# Patient Record
Sex: Female | Born: 1990 | Race: White | Hispanic: No | Marital: Married | Smoking: Never smoker
Health system: Southern US, Community
[De-identification: ages and names within clinical notes are randomized; demographics above are authoritative.]

## PROBLEM LIST (undated history)

## (undated) DIAGNOSIS — F419 Anxiety disorder, unspecified: Secondary | ICD-10-CM

## (undated) DIAGNOSIS — R87629 Unspecified abnormal cytological findings in specimens from vagina: Secondary | ICD-10-CM

## (undated) DIAGNOSIS — E039 Hypothyroidism, unspecified: Secondary | ICD-10-CM

## (undated) DIAGNOSIS — F988 Other specified behavioral and emotional disorders with onset usually occurring in childhood and adolescence: Secondary | ICD-10-CM

## (undated) HISTORY — DX: Other specified behavioral and emotional disorders with onset usually occurring in childhood and adolescence: F98.8

## (undated) HISTORY — PX: UPPER GI ENDOSCOPY: SHX6162

## (undated) HISTORY — PX: WISDOM TOOTH EXTRACTION: SHX21

## (undated) HISTORY — DX: Anxiety disorder, unspecified: F41.9

## (undated) HISTORY — DX: Unspecified abnormal cytological findings in specimens from vagina: R87.629

## (undated) HISTORY — DX: Hypothyroidism, unspecified: E03.9

---

## 2011-08-17 DIAGNOSIS — D696 Thrombocytopenia, unspecified: Secondary | ICD-10-CM | POA: Insufficient documentation

## 2012-01-04 DIAGNOSIS — F32A Depression, unspecified: Secondary | ICD-10-CM | POA: Insufficient documentation

## 2012-01-04 DIAGNOSIS — F329 Major depressive disorder, single episode, unspecified: Secondary | ICD-10-CM | POA: Insufficient documentation

## 2012-04-24 DIAGNOSIS — IMO0002 Reserved for concepts with insufficient information to code with codable children: Secondary | ICD-10-CM | POA: Insufficient documentation

## 2014-10-30 ENCOUNTER — Encounter: Payer: Self-pay | Admitting: Family Medicine

## 2014-10-30 ENCOUNTER — Ambulatory Visit (INDEPENDENT_AMBULATORY_CARE_PROVIDER_SITE_OTHER): Payer: BLUE CROSS/BLUE SHIELD | Admitting: Family Medicine

## 2014-10-30 VITALS — BP 94/57 | HR 76 | Ht 60.0 in | Wt 93.0 lb

## 2014-10-30 DIAGNOSIS — R51 Headache: Secondary | ICD-10-CM | POA: Diagnosis not present

## 2014-10-30 DIAGNOSIS — R519 Headache, unspecified: Secondary | ICD-10-CM | POA: Insufficient documentation

## 2014-10-30 DIAGNOSIS — R208 Other disturbances of skin sensation: Secondary | ICD-10-CM

## 2014-10-30 DIAGNOSIS — F411 Generalized anxiety disorder: Secondary | ICD-10-CM

## 2014-10-30 DIAGNOSIS — R200 Anesthesia of skin: Secondary | ICD-10-CM | POA: Insufficient documentation

## 2014-10-30 MED ORDER — CLONAZEPAM 0.5 MG PO TABS: 0.2500 mg | ORAL_TABLET | Freq: Every day | ORAL | Status: DC | PRN

## 2014-10-30 NOTE — Progress Notes (Signed)
CC: Danielle [name] is a 24 y.o. female is here for Establish Care and pressure in head   Subjective: HPI:  Very pleasant 24 year old here to establish care  Patient reports a history of anxiety that has been present for the majority of her adult life. She was once on citalopram however she felt that it made symptoms worse. For reasons unknown to her July has been very tough month with respect to anxiety. She tells me she had a panic attack on a daily basis ever since July 1. She's also been experiencing a pressure sensation in her head like her head is about to explode. Everyday she seems to have a different part of her body feel numb. On the 10th of this month her left side of her face was numb and she was seen at a local emergency room. Blood work was unremarkable. She denies any recent head injury. She reports poor short-term memory but this is been present her entire life. The more she thinks about her symptoms the worse they get.  She is anxious that this could be representing something seriously wrong with her brain. She denies any coordination difficulty, weakness, difficulty swallowing, difficulty talking or vision disturbance.  Review of Systems - General ROS: negative for - chills, fever, night sweats, weight gain or weight loss Ophthalmic ROS: negative for - decreased vision Psychological ROS: negative for - depression ENT ROS: negative for - hearing change, nasal congestion, tinnitus or allergies Hematological and Lymphatic ROS: negative for - bleeding problems, bruising or swollen lymph nodes Breast ROS: negative Respiratory ROS: no cough, shortness of breath, or wheezing Cardiovascular ROS: no chest pain or dyspnea on exertion Gastrointestinal ROS: no abdominal pain, change in bowel habits, or black or bloody stools Genito-Urinary ROS: negative for - genital discharge, genital ulcers, incontinence or abnormal bleeding from genitals Musculoskeletal ROS: negative for - joint pain or  muscle pain Neurological ROS: negative for - headaches or memory loss Dermatological ROS: negative for lumps, mole changes, rash and skin lesion changes  History reviewed. No pertinent past medical history.  History reviewed. No pertinent past surgical history. Family History  Problem Relation Age of Onset  . Breast cancer      grandmother   . Heart attack      grandfather  . Diabetes      grandfather   . Hypertension Mother     father     History   Social History  . Marital Status: Married    Spouse Name: N/A  . Number of Children: N/A  . Years of Education: N/A   Occupational History  . Not on file.   Social History Main Topics  . Smoking status: Never Smoker   . Smokeless tobacco: Not on file  . Alcohol Use: Not on file  . Drug Use: No  . Sexual Activity:    Partners: Male   Other Topics Concern  . Not on file   Social History Narrative  . No narrative on file     Objective: BP 94/57 mmHg  Pulse 76  Ht 5' (1.524 m)  Wt 93 lb (42.185 kg)  BMI 18.16 kg/m2  General: Alert and Oriented, No Acute Distress HEENT: Pupils equal, round, reactive to light. Conjunctivae clear. Moist mucous membranes Unremarkable Neuro: CN II-XII grossly intact, full strength/rom of all four extremities, C5/L4/S1 DTRs 2/4 bilaterally, gait normal, rapid alternating movements normal, heel-shin test normal, Rhomberg normal. Lungs: clear and comfortable work of breathing Cardiac: Regular rate and rhythm.  Extremities:  No peripheral edema.  Strong peripheral pulses.  Mental Status: mildly anxious. No depression nor agitation. Skin: Warm and dry.  Assessment & Plan: Danielle [name] was seen today for establish care and pressure in head.  Diagnoses and all orders for this visit:  Nonintractable headache, unspecified chronicity pattern, unspecified headache type Orders: -     MR Brain W Wo Contrast; Future  Facial numbness Orders: -     MR Brain W Wo Contrast; Future  Leg  numbness Orders: -     MR Brain W Wo Contrast; Future  Generalized anxiety disorder Orders: -     clonazePAM (KLONOPIN) 0.5 MG tablet; Take 0.5-1 tablets (0.25-0.5 mg total) by mouth daily as needed for anxiety.   Reassurance provided that her neurologic exam is overall reassuring. She tells me that the fear that something is wrong with her brain is overwhelming to the point where it is severely impacting the quality of her life. She would like to know if she can have an MRI of her brain. Although I would be very surprised if this were the case I discussed that some of her symptoms could represent multiple sclerosis and that an MRI would not be unreasonable to look for this. For now try to management attacks with clonazepam, she tells me she does not want to take something on a daily basis  Return if symptoms worsen or fail to improve, for Follow Up Based On MRI.

## 2014-11-02 ENCOUNTER — Ambulatory Visit (INDEPENDENT_AMBULATORY_CARE_PROVIDER_SITE_OTHER): Payer: BLUE CROSS/BLUE SHIELD

## 2014-11-02 ENCOUNTER — Other Ambulatory Visit: Payer: Self-pay | Admitting: Family Medicine

## 2014-11-02 DIAGNOSIS — R208 Other disturbances of skin sensation: Secondary | ICD-10-CM | POA: Diagnosis not present

## 2014-11-02 DIAGNOSIS — R519 Headache, unspecified: Secondary | ICD-10-CM

## 2014-11-02 DIAGNOSIS — R200 Anesthesia of skin: Secondary | ICD-10-CM

## 2014-11-02 DIAGNOSIS — R51 Headache: Principal | ICD-10-CM

## 2014-11-10 ENCOUNTER — Encounter: Payer: Self-pay | Admitting: Family Medicine

## 2014-11-10 ENCOUNTER — Ambulatory Visit (INDEPENDENT_AMBULATORY_CARE_PROVIDER_SITE_OTHER): Payer: BLUE CROSS/BLUE SHIELD | Admitting: Family Medicine

## 2014-11-10 VITALS — BP 96/65 | HR 73 | Ht 60.0 in | Wt 92.0 lb

## 2014-11-10 DIAGNOSIS — D649 Anemia, unspecified: Secondary | ICD-10-CM

## 2014-11-10 DIAGNOSIS — F411 Generalized anxiety disorder: Secondary | ICD-10-CM | POA: Diagnosis not present

## 2014-11-10 DIAGNOSIS — R002 Palpitations: Secondary | ICD-10-CM | POA: Diagnosis not present

## 2014-11-10 DIAGNOSIS — Z0000 Encounter for general adult medical examination without abnormal findings: Secondary | ICD-10-CM

## 2014-11-10 LAB — CBC
HEMATOCRIT: 37.7 % (ref 36.0–46.0)
HEMOGLOBIN: 12.4 g/dL (ref 12.0–15.0)
MCH: 28.9 pg (ref 26.0–34.0)
MCHC: 32.9 g/dL (ref 30.0–36.0)
MCV: 87.9 fL (ref 78.0–100.0)
MPV: 13.4 fL — AB (ref 8.6–12.4)
PLATELETS: 153 10*3/uL (ref 150–400)
RBC: 4.29 MIL/uL (ref 3.87–5.11)
RDW: 13.7 % (ref 11.5–15.5)
WBC: 4.1 10*3/uL (ref 4.0–10.5)

## 2014-11-10 MED ORDER — CLONAZEPAM 0.5 MG PO TABS: 0.2500 mg | ORAL_TABLET | Freq: Every day | ORAL | Status: DC | PRN

## 2014-11-10 NOTE — Patient Instructions (Signed)
Dr. Natilee Gauer's General Advice Following Your Complete Physical Exam  The Benefits of Regular Exercise: Unless you suffer from an uncontrolled cardiovascular condition, studies strongly suggest that regular exercise and physical activity will add to both the quality and length of your life.  The World Health Organization recommends 150 minutes of moderate intensity aerobic activity every week.  This is best split over 3-4 days a week, and can be as simple as a brisk walk for just over 35 minutes "most days of the week".  This type of exercise has been shown to lower LDL-Cholesterol, lower average blood sugars, lower blood pressure, lower cardiovascular disease risk, improve memory, and increase one's overall sense of wellbeing.  The addition of anaerobic (or "strength training") exercises offers additional benefits including but not limited to increased metabolism, prevention of osteoporosis, and improved overall cholesterol levels.  How Can I Strive For A Low-Fat Diet?: Current guidelines recommend that 25-35 percent of your daily energy (food) intake should come from fats.  One might ask how can this be achieved without having to dissect each meal on a daily basis?  Switch to skim or 1% milk instead of whole milk.  Focus on lean meats such as ground turkey, fresh fish, baked chicken, and lean cuts of beef as your source of dietary protein.  Limit saturated fat consumption to less than 10% of your daily caloric intake.  Limit trans fatty acid consumption primarily by limiting synthetic trans fats such as partially hydrogenated oils (Ex: fried fast foods).  Substitute olive or vegetable oil for solid fats where possible.  Moderation of Salt Intake: Provided you don't carry a diagnosis of congestive heart failure nor renal failure, I recommend a daily allowance of no more than 2300 mg of salt (sodium).  Keeping under this daily goal is associated with a decreased risk of cardiovascular events, creeping  above it can lead to elevated blood pressures and increases your risk of cardiovascular events.  Milligrams (mg) of salt is listed on all nutrition labels, and your daily intake can add up faster than you think.  Most canned and frozen dinners can pack in over half your daily salt allowance in one meal.    Lifestyle Health Risks: Certain lifestyle choices carry specific health risks.  As you may already know, tobacco use has been associated with increasing one's risk of cardiovascular disease, pulmonary disease, numerous cancers, among many other issues.  What you may not know is that there are medications and nicotine replacement strategies that can more than double your chances of successfully quitting.  I would be thrilled to help manage your quitting strategy if you currently use tobacco products.  When it comes to alcohol use, I've yet to find an "ideal" daily allowance.  Provided an individual does not have a medical condition that is exacerbated by alcohol consumption, general guidelines determine "safe drinking" as no more than two standard drinks for a man or no more than one standard drink for a female per day.  However, much debate still exists on whether any amount of alcohol consumption is technically "safe".  My general advice, keep alcohol consumption to a minimum for general health promotion.  If you or others believe that alcohol, tobacco, or recreational drug use is interfering with your life, I would be happy to provide confidential counseling regarding treatment options.  General "Over The Counter" Nutrition Advice: Postmenopausal women should aim for a daily calcium intake of 1200 mg, however a significant portion of this might already be   provided by diets including milk, yogurt, cheese, and other dairy products.  Vitamin D has been shown to help preserve bone density, prevent fatigue, and has even been shown to help reduce falls in the elderly.  Ensuring a daily intake of 800 Units of  Vitamin D is a good place to start to enjoy the above benefits, we can easily check your Vitamin D level to see if you'd potentially benefit from supplementation beyond 800 Units a day.  Folic Acid intake should be of particular concern to women of childbearing age.  Daily consumption of 400-800 mcg of Folic Acid is recommended to minimize the chance of spinal cord defects in a fetus should pregnancy occur.    For many adults, accidents still remain one of the most common culprits when it comes to cause of death.  Some of the simplest but most effective preventitive habits you can adopt include regular seatbelt use, proper helmet use, securing firearms, and regularly testing your smoke and carbon monoxide detectors.  Giah Fickett B. Herberth Deharo DO [facility] [address], [address] [place], [place] [address] Phone: [phone]  

## 2014-11-10 NOTE — Progress Notes (Signed)
CC: Danielle [name] is a 24 y.o. female is here for Annual Exam   Subjective: HPI:  Colonoscopy: no current indication Papsmear: she's not sure she's had one since she turned 21, [facility], defer to her OB/GYN Mammogram: no current indication   Influenza Vaccine: not indicated Pneumovax: no indication Td/Tdap: Tdap UTD until 2019 Zoster: (Start 24 yo)  Presents for complete physical exam requesting refill on clonazepam which is helping with anxiety. Her only complaint today is a flutter in her left chest that lasted 1 second when she was out shopping with her husband last week. She also has a history of anemia and since she is a vegan she would like to know if she is deficient in any vitamins.  Review of Systems - General ROS: negative for - chills, fever, night sweats, weight gain or weight loss Ophthalmic ROS: negative for - decreased vision Psychological ROS: negative for - anxiety or depression ENT ROS: negative for - hearing change, nasal congestion, tinnitus or allergies Hematological and Lymphatic ROS: negative for - bleeding problems, bruising or swollen lymph nodes Breast ROS: negative Respiratory ROS: no cough, shortness of breath, or wheezing Cardiovascular ROS: no chest pain or dyspnea on exertion Gastrointestinal ROS: no abdominal pain, change in bowel habits, or black or bloody stools Genito-Urinary ROS: negative for - genital discharge, genital ulcers, incontinence or abnormal bleeding from genitals Musculoskeletal ROS: negative for - joint pain or muscle pain Neurological ROS: negative for - headaches or memory loss Dermatological ROS: negative for lumps, mole changes, rash and skin lesion changes  No past medical history on file.  No past surgical history on file. Family History  Problem Relation Age of Onset  . Breast cancer      grandmother   . Heart attack      grandfather  . Diabetes      grandfather   . Hypertension  Mother     father     History   Social History  . Marital Status: Married    Spouse Name: N/A  . Number of Children: N/A  . Years of Education: N/A   Occupational History  . Not on file.   Social History Main Topics  . Smoking status: Never Smoker   . Smokeless tobacco: Not on file  . Alcohol Use: Not on file  . Drug Use: No  . Sexual Activity:    Partners: Male   Other Topics Concern  . Not on file   Social History Narrative  . No narrative on file     Objective: BP 96/65 mmHg  Pulse 73  Ht 5' (1.524 m)  Wt 92 lb (41.731 kg)  BMI 17.97 kg/m2  LMP 09/30/2014 (Approximate)  General: No Acute Distress HEENT: Atraumatic, normocephalic, conjunctivae normal without scleral icterus.  No nasal discharge, hearing grossly intact, TMs with good landmarks bilaterally with no middle ear abnormalities, posterior pharynx clear without oral lesions. Neck: Supple, trachea midline, no cervical nor supraclavicular adenopathy. Pulmonary: Clear to auscultation bilaterally without wheezing, rhonchi, nor rales. Cardiac: Regular rate and rhythm.  No murmurs, rubs, nor gallops. No peripheral edema.  2+ peripheral pulses bilaterally. Abdomen: Bowel sounds normal.  No masses.  Non-tender without rebound.  Negative Murphy's sign. ZO:[id number]: Grossly intact, no signs of weakness.  Full strength throughout upper and lower extremities.  Full ROM in upper and lower extremities.  No midline spinal tenderness. Neuro: Gait unremarkable, CN II-XII grossly intact.  C5-C6 Reflex 2/4 Bilaterally,  L4 Reflex 2/4 Bilaterally.  Cerebellar function intact. Skin: No rashes. Psych: Alert and oriented to person/place/time.  Thought process normal. No anxiety/depression.   Assessment & Plan: [name] was seen today for annual exam.  Diagnoses and all orders for this visit:  Annual physical exam Orders: -     COMPLETE METABOLIC PANEL WITH GFR -     CBC -     Lipid panel -     TSH -     Ferritin -      Vit D  25 hydroxy (rtn osteoporosis monitoring) -     Vitamin B12  Generalized anxiety disorder Orders: -     clonazePAM (KLONOPIN) 0.5 MG tablet; Take 0.5-1 tablets (0.25-0.5 mg total) by mouth daily as needed for anxiety.  Anemia, unspecified anemia type Orders: -     CBC -     Ferritin -     Vitamin B12  Palpitations   Healthy lifestyle interventions including but not limited to regular exercise, a healthy low fat diet, moderation of salt intake, the dangers of tobacco/alcohol/recreational drug use, nutrition supplementation, and accident avoidance were discussed with the patient and a handout was provided for future reference.  Screening for vitamin D deficiencies with labs above, reassurance provided she most likely experienced palpitation and I will make sure that thyroid function is normal and electrolytes are normal as well. Reassurance provided that this is a benign condition provided the above labs are normal.  Return if symptoms worsen or fail to improve.

## 2014-11-11 ENCOUNTER — Telehealth: Payer: Self-pay | Admitting: Family Medicine

## 2014-11-11 DIAGNOSIS — E039 Hypothyroidism, unspecified: Secondary | ICD-10-CM

## 2014-11-11 DIAGNOSIS — E559 Vitamin D deficiency, unspecified: Secondary | ICD-10-CM

## 2014-11-11 LAB — COMPLETE METABOLIC PANEL WITH GFR
ALBUMIN: 4.3 g/dL (ref 3.6–5.1)
ALT: 20 U/L (ref 6–29)
AST: 23 U/L (ref 10–30)
Alkaline Phosphatase: 55 U/L (ref 33–115)
BUN: 12 mg/dL (ref 7–25)
CALCIUM: 9.5 mg/dL (ref 8.6–10.2)
CO2: 25 mmol/L (ref 20–31)
Chloride: 106 mmol/L (ref 98–110)
Creat: 0.54 mg/dL (ref 0.50–1.10)
GFR, Est African American: >89 mL/min (ref >=60)
GLUCOSE: 90 mg/dL (ref 65–99)
Potassium: 4.5 mmol/L (ref 3.5–5.3)
Sodium: 142 mmol/L (ref 135–146)
Total Bilirubin: 0.9 mg/dL (ref 0.2–1.2)
Total Protein: 7 g/dL (ref 6.1–8.1)

## 2014-11-11 LAB — VITAMIN D 25 HYDROXY (VIT D DEFICIENCY, FRACTURES): Vit D, 25-Hydroxy: 23 ng/mL — ABNORMAL LOW (ref 30–100)

## 2014-11-11 LAB — LIPID PANEL
CHOL/HDL RATIO: 2.3 ratio (ref <=5.0)
Cholesterol: 143 mg/dL (ref 125–200)
HDL: 61 mg/dL (ref >=46)
LDL Cholesterol: 68 mg/dL (ref <130)
Triglycerides: 72 mg/dL (ref <150)
VLDL: 14 mg/dL (ref <30)

## 2014-11-11 LAB — FERRITIN: FERRITIN: 12 ng/mL (ref 10–291)

## 2014-11-11 LAB — TSH: TSH: 5.398 u[IU]/mL — AB (ref 0.350–4.500)

## 2014-11-11 LAB — VITAMIN B12: Vitamin B-12: 448 pg/mL (ref 211–911)

## 2014-11-11 MED ORDER — VITAMIN D (ERGOCALCIFEROL) 1.25 MG (50000 UNIT) PO CAPS: 50000.0000 [IU] | ORAL_CAPSULE | ORAL | Status: DC

## 2014-11-11 NOTE — Telephone Encounter (Signed)
Amber, Will you please let patient know that her labs were normal other than a low vitamin D level and signs of mild hypothyroidism.  I've sent a weekly vitmain d supplement to her [facility] and would recommend that she have her thyroid function rechecked in one week.  Lab slip in your inbox.

## 2014-11-17 NOTE — Telephone Encounter (Signed)
Pt notified of results & rx. 

## 2014-11-26 LAB — TSH: TSH: 2.656 u[IU]/mL (ref 0.350–4.500)

## 2014-12-14 ENCOUNTER — Other Ambulatory Visit: Payer: Self-pay | Admitting: Family Medicine

## 2015-01-12 ENCOUNTER — Telehealth: Payer: Self-pay

## 2015-01-12 NOTE — Telephone Encounter (Signed)
Danielle [name] has [employer] on 02/17/15 and feels because of her anxiety disorder she can't go through with it.  So is she asking are you willing to write her a letter stating why she's not fit to go through with this duty.  If your willing to write the letter it has be mailed within 10 business days before the date of 02/17/15.

## 2015-01-13 ENCOUNTER — Other Ambulatory Visit: Payer: Self-pay | Admitting: Family Medicine

## 2015-01-13 ENCOUNTER — Other Ambulatory Visit: Payer: Self-pay | Admitting: *Deleted

## 2015-01-13 MED ORDER — CLONAZEPAM 0.5 MG PO TABS: ORAL_TABLET | ORAL | Status: DC

## 2015-01-18 NOTE — Telephone Encounter (Signed)
Danielle [name], Letter printed and is in you in box.

## 2015-01-19 NOTE — Telephone Encounter (Signed)
Pt notified and letter up front

## 2015-02-15 ENCOUNTER — Other Ambulatory Visit: Payer: Self-pay | Admitting: Family Medicine

## 2015-03-23 ENCOUNTER — Other Ambulatory Visit: Payer: Self-pay | Admitting: Family Medicine

## 2015-04-06 ENCOUNTER — Ambulatory Visit (INDEPENDENT_AMBULATORY_CARE_PROVIDER_SITE_OTHER): Payer: BLUE CROSS/BLUE SHIELD | Admitting: Family Medicine

## 2015-04-06 ENCOUNTER — Encounter: Payer: Self-pay | Admitting: Family Medicine

## 2015-04-06 VITALS — BP 101/66 | HR 82 | Wt 104.0 lb

## 2015-04-06 DIAGNOSIS — F411 Generalized anxiety disorder: Secondary | ICD-10-CM | POA: Diagnosis not present

## 2015-04-06 DIAGNOSIS — R079 Chest pain, unspecified: Secondary | ICD-10-CM

## 2015-04-06 MED ORDER — CLONAZEPAM 0.5 MG PO TABS: ORAL_TABLET | ORAL | Status: DC

## 2015-04-06 NOTE — Progress Notes (Signed)
CC: Danielle [name] is a 25 y.o. female is here for Medication Refill   Subjective: HPI:  Follow-up anxiety: She estimates that she is taking clonazepam 2-3 times a week. She occasionally will feel sensation of panic and that something bad is about to happen. She is uncertain about triggers. She has noticed that symptoms are worse when she's stressed from work and improves more physical activity she does. Denies any depression or any other mental disturbance. She denies any known side effects from the clonazepam and states it does not cause much sedation.  Complains of chest pain localized in the anterior aspect of the chest which is sharp and nonradiating. It comes and goes without any reproducible behavior. It'll last for a few seconds. It's moderate in severity. Nothing seems to make it better or worse. It's only been going on for about a week now. She denies any shortness of breath, cough, wheezing or exertional chest pain. Denies any overlying skin changes.   Review Of Systems Outlined In HPI  No past medical history on file.  No past surgical history on file. Family History  Problem Relation Age of Onset  . Breast cancer      grandmother   . Heart attack      grandfather  . Diabetes      grandfather   . Hypertension Mother     father     Social History   Social History  . Marital Status: Married    Spouse Name: N/A  . Number of Children: N/A  . Years of Education: N/A   Occupational History  . Not on file.   Social History Main Topics  . Smoking status: Never Smoker   . Smokeless tobacco: Not on file  . Alcohol Use: Not on file  . Drug Use: No  . Sexual Activity:    Partners: Male   Other Topics Concern  . Not on file   Social History Narrative     Objective: BP 101/66 mmHg  Pulse 82  Wt 104 lb (47.174 kg)  General: Alert and Oriented, No Acute Distress HEENT: Pupils equal, round, reactive to light. Conjunctivae clear.  Moist mucous membranes Lungs:  Clear to auscultation bilaterally, no wheezing/ronchi/rales.  Comfortable work of breathing. Good air movement. Cardiac: Regular rate and rhythm. Normal S1/S2.  No murmurs, rubs, nor gallops.   Extremities: No peripheral edema.  Strong peripheral pulses.  Mental Status: No depression, anxiety, nor agitation. Skin: Warm and dry.  Assessment & Plan: Danielle [name] was seen today for medication refill.  Diagnoses and all orders for this visit:  Generalized anxiety disorder -     clonazePAM (KLONOPIN) 0.5 MG tablet; TAKE 1/2-1 TABLET BY MOUTH EVERY DAY AS NEEDED FOR ANXIETY  Chest pain, unspecified chest pain type   Generalized anxiety disorder: Control continue Klonopin on an as-needed basis, have also offered starting on something like Effexor however she was so intolerant to Lexapro she would not like to pursue this. Chest pain: Most likely costochondritis. Reassurance provided.Signs and symptoms requring emergent/urgent reevaluation were discussed with the patient. Consider starting ibuprofen on an as-needed basis.  25 minutes spent face-to-face during visit today of which at least 50% was counseling or coordinating care regarding: 1. Generalized anxiety disorder   2. Chest pain, unspecified chest pain type      Return for 3-6 months for mood recheck.

## 2015-06-10 ENCOUNTER — Encounter: Payer: Self-pay | Admitting: Family Medicine

## 2015-06-10 DIAGNOSIS — R519 Headache, unspecified: Secondary | ICD-10-CM

## 2015-06-10 DIAGNOSIS — R51 Headache: Principal | ICD-10-CM

## 2015-06-11 ENCOUNTER — Other Ambulatory Visit: Payer: Self-pay

## 2015-06-11 DIAGNOSIS — F411 Generalized anxiety disorder: Secondary | ICD-10-CM

## 2015-06-11 MED ORDER — CLONAZEPAM 0.5 MG PO TABS
ORAL_TABLET | ORAL | Status: DC
Start: 2015-06-11 — End: 2015-10-26

## 2015-06-21 ENCOUNTER — Ambulatory Visit (INDEPENDENT_AMBULATORY_CARE_PROVIDER_SITE_OTHER): Payer: BLUE CROSS/BLUE SHIELD

## 2015-06-21 DIAGNOSIS — R51 Headache: Secondary | ICD-10-CM | POA: Diagnosis not present

## 2015-06-21 MED ORDER — GADOBENATE DIMEGLUMINE 529 MG/ML IV SOLN
10.0000 mL | Freq: Once | INTRAVENOUS | Status: AC | PRN
  Administered 2015-06-21: 10 mL via INTRAVENOUS

## 2015-09-01 ENCOUNTER — Ambulatory Visit (INDEPENDENT_AMBULATORY_CARE_PROVIDER_SITE_OTHER): Payer: BLUE CROSS/BLUE SHIELD | Admitting: Family Medicine

## 2015-09-01 ENCOUNTER — Encounter: Payer: Self-pay | Admitting: Family Medicine

## 2015-09-01 VITALS — BP 108/73 | HR 73 | Wt 105.0 lb

## 2015-09-01 DIAGNOSIS — R079 Chest pain, unspecified: Secondary | ICD-10-CM | POA: Diagnosis not present

## 2015-09-01 NOTE — Progress Notes (Signed)
CC: Danielle [name] is a 25 y.o. female is here for Chest Pain   Subjective: HPI:  She is continuing to experience chest pain that localized in the center of the chest and it fluctuates from a sharp quick sensation to a dull heavy sensation. It'll last anywhere from a few seconds to 30 minutes. It seems to be most apparent when she is psychological stressed-out by work. She is not able to induce with exertion. Her grandfather had his first heart attack when he was in his early 5830s and she is worried that something might be wrong with her heart. She denies any shortness of breath or any motor or sensory disturbances. She denies any pain with breathing or cough. She denies any limb claudication or recent bleeding. She denies any fatigue. Symptoms were mild in the past but now moderate in severity. Chest pain occurs a couple times a week. Pain is nonradiating   Review Of Systems Outlined In HPI  History reviewed. No pertinent past medical history.  History reviewed. No pertinent past surgical history. Family History  Problem Relation Age of Onset  . Breast cancer      grandmother   . Heart attack      grandfather  . Diabetes      grandfather   . Hypertension Mother     father     Social History   Social History  . Marital Status: Married    Spouse Name: N/A  . Number of Children: N/A  . Years of Education: N/A   Occupational History  . Not on file.   Social History Main Topics  . Smoking status: Never Smoker   . Smokeless tobacco: Not on file  . Alcohol Use: Not on file  . Drug Use: No  . Sexual Activity:    Partners: Male   Other Topics Concern  . Not on file   Social History Narrative     Objective: BP 108/73 mmHg  Pulse 73  Wt 105 lb (47.628 kg)  General: Alert and Oriented, No Acute Distress HEENT: Pupils equal, round, reactive to light. Conjunctivae clear. Moist mucous membranes pharynx unremarkable Lungs: Clear to auscultation bilaterally, no  wheezing/ronchi/rales.  Comfortable work of breathing. Good air movement. Cardiac: Regular rate and rhythm. Normal S1/S2.  No murmurs, rubs, nor gallops.   Extremities: No peripheral edema.  Strong peripheral pulses.  Mental Status: No depression, anxiety, nor agitation. Skin: Warm and dry.  Assessment & Plan: Danielle [name] was seen today for chest pain.  Diagnoses and all orders for this visit:  Chest pain, unspecified chest pain type -     EKG 12-Lead -     Exercise Tolerance Test   EKG was obtained showing normal sinus rhythm with normal axis and no pathologic Q waves nor ST segment elevation or depression. She and I both in agreement that this is most likely due to psychological stress and costochondritis however I offered a stress test to completely rule out possibility of it coming from her heart and she would like to pursue this.   Return if symptoms worsen or fail to improve.

## 2015-09-24 ENCOUNTER — Ambulatory Visit (INDEPENDENT_AMBULATORY_CARE_PROVIDER_SITE_OTHER): Payer: BLUE CROSS/BLUE SHIELD

## 2015-09-24 DIAGNOSIS — R079 Chest pain, unspecified: Secondary | ICD-10-CM | POA: Diagnosis not present

## 2015-09-24 LAB — EXERCISE TOLERANCE TEST
CHL CUP RESTING HR STRESS: 81 {beats}/min
CSEPED: 9 min
CSEPHR: 92 %
Estimated workload: 10.1 METS
Exercise duration (sec): 0 s
MPHR: 196 {beats}/min
Peak HR: 181 {beats}/min
RPE: 16

## 2015-10-26 ENCOUNTER — Other Ambulatory Visit: Payer: Self-pay | Admitting: Family Medicine

## 2015-10-26 DIAGNOSIS — F411 Generalized anxiety disorder: Secondary | ICD-10-CM

## 2015-11-17 ENCOUNTER — Encounter: Payer: Self-pay | Admitting: Family Medicine

## 2015-11-17 ENCOUNTER — Ambulatory Visit (INDEPENDENT_AMBULATORY_CARE_PROVIDER_SITE_OTHER): Payer: BLUE CROSS/BLUE SHIELD | Admitting: Family Medicine

## 2015-11-17 VITALS — BP 111/75 | HR 82 | Wt 110.0 lb

## 2015-11-17 DIAGNOSIS — Z789 Other specified health status: Secondary | ICD-10-CM

## 2015-11-17 DIAGNOSIS — Z0000 Encounter for general adult medical examination without abnormal findings: Secondary | ICD-10-CM

## 2015-11-17 DIAGNOSIS — F411 Generalized anxiety disorder: Secondary | ICD-10-CM

## 2015-11-17 LAB — COMPLETE METABOLIC PANEL WITH GFR
ALT: 14 U/L (ref 6–29)
AST: 21 U/L (ref 10–30)
Albumin: 4.6 g/dL (ref 3.6–5.1)
Alkaline Phosphatase: 41 U/L (ref 33–115)
BUN: 8 mg/dL (ref 7–25)
CHLORIDE: 105 mmol/L (ref 98–110)
CO2: 26 mmol/L (ref 20–31)
Calcium: 9.4 mg/dL (ref 8.6–10.2)
Creat: 0.59 mg/dL (ref 0.50–1.10)
GFR, Est African American: >89 mL/min (ref >=60)
GFR, Est Non African American: >89 mL/min (ref >=60)
Glucose, Bld: 88 mg/dL (ref 65–99)
POTASSIUM: 4.5 mmol/L (ref 3.5–5.3)
SODIUM: 138 mmol/L (ref 135–146)
TOTAL PROTEIN: 6.9 g/dL (ref 6.1–8.1)
Total Bilirubin: 1.8 mg/dL — ABNORMAL HIGH (ref 0.2–1.2)

## 2015-11-17 LAB — CBC
HEMATOCRIT: 40.8 % (ref 35.0–45.0)
HEMOGLOBIN: 13.8 g/dL (ref 11.7–15.5)
MCH: 30.3 pg (ref 27.0–33.0)
MCHC: 33.8 g/dL (ref 32.0–36.0)
MCV: 89.5 fL (ref 80.0–100.0)
MPV: 12.1 fL (ref 7.5–12.5)
Platelets: 156 10*3/uL (ref 140–400)
RBC: 4.56 MIL/uL (ref 3.80–5.10)
RDW: 12.7 % (ref 11.0–15.0)
WBC: 5.5 10*3/uL (ref 3.8–10.8)

## 2015-11-17 LAB — LIPID PANEL
Cholesterol: 141 mg/dL (ref 125–200)
HDL: 68 mg/dL (ref >=46)
LDL CALC: 59 mg/dL (ref <130)
Total CHOL/HDL Ratio: 2.1 Ratio (ref <=5.0)
Triglycerides: 72 mg/dL (ref <150)
VLDL: 14 mg/dL (ref <30)

## 2015-11-17 LAB — FERRITIN: Ferritin: 23 ng/mL (ref 10–154)

## 2015-11-17 MED ORDER — CLONAZEPAM 0.5 MG PO TABS: ORAL_TABLET | ORAL | 1 refills | Status: DC

## 2015-11-17 NOTE — Progress Notes (Signed)
CC: Danielle [name] Lina is a 25 y.o. female is here for Annual Exam   Subjective: HPI:  Colonoscopy: No current indication Papsmear: Overdue for Pap smear, offered to provide her with this today however she would prefer a female Mammogram: No current indication  Influenza Vaccine: Will receive once available Pneumovax: No current indication Td/Tdap: Tdap UTD until 2019 Zoster: (Start 25 yo)  Requesting refill on clonazepam and would like to have her ferritin level checked since she is a vegan.  Review of Systems - General ROS: negative for - chills, fever, night sweats, weight gain or weight loss Ophthalmic ROS: negative for - decreased vision Psychological ROS: negative for - anxiety or depression ENT ROS: negative for - hearing change, nasal congestion, tinnitus or allergies Hematological and Lymphatic ROS: negative for - bleeding problems, bruising or swollen lymph nodes Breast ROS: negative Respiratory ROS: no cough, shortness of breath, or wheezing Cardiovascular ROS: no chest pain or dyspnea on exertion Gastrointestinal ROS: no abdominal pain, change in bowel habits, or black or bloody stools Genito-Urinary ROS: negative for - genital discharge, genital ulcers, incontinence or abnormal bleeding from genitals Musculoskeletal ROS: negative for - joint pain or muscle pain Neurological ROS: negative for - headaches or memory loss Dermatological ROS: negative for lumps, mole changes, rash and skin lesion changes No past medical history on file.  No past surgical history on file. Family History  Problem Relation Age of Onset  . Breast cancer      grandmother   . Heart attack      grandfather  . Diabetes      grandfather   . Hypertension Mother     father     Social History   Social History  . Marital status: Married    Spouse name: N/A  . Number of children: N/A  . Years of education: N/A   Occupational History  . Not on file.   Social History Main Topics  . Smoking  status: Never Smoker  . Smokeless tobacco: Not on file  . Alcohol use Not on file  . Drug use: No  . Sexual activity: Yes    Partners: Male   Other Topics Concern  . Not on file   Social History Narrative  . No narrative on file     Objective: BP 111/75   Pulse 82   Wt 110 lb (49.9 kg)   BMI 21.48 kg/m   General: No Acute Distress HEENT: Atraumatic, normocephalic, conjunctivae normal without scleral icterus.  No nasal discharge, hearing grossly intact, TMs with good landmarks bilaterally with no middle ear abnormalities, posterior pharynx clear without oral lesions. Neck: Supple, trachea midline, no cervical nor supraclavicular adenopathy. Pulmonary: Clear to auscultation bilaterally without wheezing, rhonchi, nor rales. Cardiac: Regular rate and rhythm.  No murmurs, rubs, nor gallops. No peripheral edema.  2+ peripheral pulses bilaterally. Abdomen: Bowel sounds normal.  No masses.  Non-tender without rebound.  Negative Murphy's sign. MSK: Grossly intact, no signs of weakness.  Full strength throughout upper and lower extremities.  Full ROM in upper and lower extremities.  No midline spinal tenderness. Neuro: Gait unremarkable, CN II-XII grossly intact.  C5-C6 Reflex 2/4 Bilaterally, L4 Reflex 2/4 Bilaterally.  Cerebellar function intact. Skin: No rashes. Psych: Alert and oriented to person/place/time.  Thought process normal. No anxiety/depression.  Assessment & Plan: [name] was seen today for annual exam.  Diagnoses and all orders for this visit:  Annual physical exam  Generalized anxiety disorder -     clonazePAM (KLONOPIN)  0.5 MG tablet; TAKE 1/2 TO 1 TABLET BY MOUTH EVERY DAY AS NEEDED FOR ANXIETY  Vegan diet -     Lipid panel -     COMPLETE METABOLIC PANEL WITH GFR -     CBC -     Ferritin   Healthy lifestyle interventions including but not limited to regular exercise, a healthy low fat diet, moderation of salt intake, the dangers of tobacco/alcohol/recreational  drug use, nutrition supplementation, and accident avoidance were discussed with the patient and a handout was provided for future reference.  No Follow-up on file.

## 2016-02-15 ENCOUNTER — Other Ambulatory Visit: Payer: Self-pay

## 2016-02-15 DIAGNOSIS — F411 Generalized anxiety disorder: Secondary | ICD-10-CM

## 2016-02-15 MED ORDER — CLONAZEPAM 0.5 MG PO TABS: ORAL_TABLET | ORAL | 0 refills | Status: DC

## 2016-03-16 ENCOUNTER — Telehealth: Payer: Self-pay | Admitting: *Deleted

## 2016-03-16 NOTE — Telephone Encounter (Signed)
I would like to have an appt to meet you at some time but yes just take last years letter and I will sign. Please make appt to establish so that we can document for this correctly.

## 2016-03-16 NOTE — Telephone Encounter (Signed)
Pt left vm asking if you would write her an excuse from jury duty due to her anxiety.  Dr. Ivan AnchorsHommel wrote her one last year as well.  Please advise.

## 2016-03-17 ENCOUNTER — Encounter: Payer: Self-pay | Admitting: *Deleted

## 2016-03-17 NOTE — Telephone Encounter (Signed)
Pt called back & was notified of letter.

## 2016-03-17 NOTE — Telephone Encounter (Signed)
Tried calling pt back but her vm is full.

## 2016-04-06 ENCOUNTER — Other Ambulatory Visit: Payer: Self-pay | Admitting: Physician Assistant

## 2016-04-06 DIAGNOSIS — F411 Generalized anxiety disorder: Secondary | ICD-10-CM

## 2016-05-15 ENCOUNTER — Encounter: Payer: Self-pay | Admitting: Physician Assistant

## 2016-05-15 ENCOUNTER — Ambulatory Visit (INDEPENDENT_AMBULATORY_CARE_PROVIDER_SITE_OTHER): Payer: BLUE CROSS/BLUE SHIELD | Admitting: Physician Assistant

## 2016-05-15 VITALS — BP 112/72 | HR 78 | Wt 112.0 lb

## 2016-05-15 DIAGNOSIS — F411 Generalized anxiety disorder: Secondary | ICD-10-CM

## 2016-05-15 DIAGNOSIS — F410 Panic disorder [episodic paroxysmal anxiety] without agoraphobia: Secondary | ICD-10-CM | POA: Insufficient documentation

## 2016-05-15 DIAGNOSIS — Z23 Encounter for immunization: Secondary | ICD-10-CM

## 2016-05-15 MED ORDER — CLONAZEPAM 0.5 MG PO TABS: ORAL_TABLET | ORAL | 3 refills | Status: DC

## 2016-05-15 NOTE — Progress Notes (Signed)
   Subjective:    Patient ID: Danielle [name], female    DOB: Aug 08, 1990, 26 y.o.   MRN: [id number]  HPI  Pt is a 26 yo female who presents to the clinic for medication refill. She was a Dr. Ivan Anchorshommel patient. She is taking klonapin once or twice a week for anxiety and panic attacks. She is in counseling and working on cognitive treatment for anxiety. She tried lexapro/celexa in the past and just seemed to make it worse. She is doing yoga which helps. Her anxiety at times presents with chest pain. She has had a normal stress test about 1 year ago.    Review of Systems  All other systems reviewed and are negative.      Objective:   Physical Exam  Constitutional: She is oriented to person, place, and time. She appears well-developed and well-nourished.  HENT:  Head: Normocephalic and atraumatic.  Neck: Normal range of motion. Neck supple. No thyromegaly present.  Cardiovascular: Normal rate, regular rhythm and normal heart sounds.   Pulmonary/Chest: Effort normal and breath sounds normal.  Lymphadenopathy:    She has no cervical adenopathy.  Neurological: She is alert and oriented to person, place, and time.  Psychiatric: She has a normal mood and affect. Her behavior is normal.          Assessment & Plan:  Marland Kitchen.Marland Kitchen.Diagnoses and all orders for this visit:  Generalized anxiety disorder -     clonazePAM (KLONOPIN) 0.5 MG tablet; TAKE 1/2 TO 1 TABLET BY MOUTH EVERY DAY AS NEEDED FOR ANXIETY. -     TSH -     COMPLETE METABOLIC PANEL WITH GFR -     Tdap vaccine greater than or equal to 7yo IM  Panic attack -     TSH -     COMPLETE METABOLIC PANEL WITH GFR   TSH ordered due to being elevated in the past.  Refilled klonpain to continue to use only as needed due to abuse potential. Continue exercise/yoga/meditation.   Needs PAP and CPE.

## 2016-05-23 ENCOUNTER — Encounter: Payer: Self-pay | Admitting: Physician Assistant

## 2016-05-23 LAB — COMPLETE METABOLIC PANEL WITH GFR
ALBUMIN: 4.7 g/dL (ref 3.6–5.1)
ALK PHOS: 46 U/L (ref 33–115)
ALT: 14 U/L (ref 6–29)
AST: 17 U/L (ref 10–30)
BILIRUBIN TOTAL: 0.9 mg/dL (ref 0.2–1.2)
BUN: 7 mg/dL (ref 7–25)
CALCIUM: 9.5 mg/dL (ref 8.6–10.2)
CO2: 27 mmol/L (ref 20–31)
CREATININE: 0.61 mg/dL (ref 0.50–1.10)
Chloride: 105 mmol/L (ref 98–110)
GFR, Est African American: >89 mL/min (ref >=60)
GFR, Est Non African American: >89 mL/min (ref >=60)
GLUCOSE: 81 mg/dL (ref 65–99)
Potassium: 3.8 mmol/L (ref 3.5–5.3)
Sodium: 138 mmol/L (ref 135–146)
TOTAL PROTEIN: 7.3 g/dL (ref 6.1–8.1)

## 2016-05-23 LAB — TSH: TSH: 7.65 mIU/L — ABNORMAL HIGH

## 2016-05-23 NOTE — Progress Notes (Signed)
Call pt: TSH is a little elevated. Meaning free levels are decreased a little. Would you like to try low dose thyroid supplementation?

## 2016-07-20 ENCOUNTER — Ambulatory Visit (INDEPENDENT_AMBULATORY_CARE_PROVIDER_SITE_OTHER): Payer: BLUE CROSS/BLUE SHIELD | Admitting: Physician Assistant

## 2016-07-20 ENCOUNTER — Encounter: Payer: Self-pay | Admitting: Physician Assistant

## 2016-07-20 VITALS — BP 99/65 | HR 84 | Ht 60.0 in | Wt 112.0 lb

## 2016-07-20 DIAGNOSIS — R519 Headache, unspecified: Secondary | ICD-10-CM

## 2016-07-20 DIAGNOSIS — E010 Iodine-deficiency related diffuse (endemic) goiter: Secondary | ICD-10-CM | POA: Diagnosis not present

## 2016-07-20 DIAGNOSIS — E039 Hypothyroidism, unspecified: Secondary | ICD-10-CM | POA: Diagnosis not present

## 2016-07-20 DIAGNOSIS — R51 Headache: Secondary | ICD-10-CM | POA: Diagnosis not present

## 2016-07-20 DIAGNOSIS — K582 Mixed irritable bowel syndrome: Secondary | ICD-10-CM | POA: Diagnosis not present

## 2016-07-20 DIAGNOSIS — R5383 Other fatigue: Secondary | ICD-10-CM | POA: Diagnosis not present

## 2016-07-20 MED ORDER — LEVOTHYROXINE SODIUM 50 MCG PO TABS: 50.0000 ug | ORAL_TABLET | Freq: Every day | ORAL | 1 refills | Status: DC

## 2016-07-20 NOTE — Patient Instructions (Signed)
Hypothyroidism Hypothyroidism is a disorder of the thyroid. The thyroid is a large gland that is located in the lower front of the neck. The thyroid releases hormones that control how the body works. With hypothyroidism, the thyroid does not make enough of these hormones. What are the causes? Causes of hypothyroidism may include:  Viral infections.  Pregnancy.  Your own defense system (immune system) attacking your thyroid.  Certain medicines.  Birth defects.  Past radiation treatments to your head or neck.  Past treatment with radioactive iodine.  Past surgical removal of part or all of your thyroid.  Problems with the gland that is located in the center of your brain (pituitary).  What are the signs or symptoms? Signs and symptoms of hypothyroidism may include:  Feeling as though you have no energy (lethargy).  Inability to tolerate cold.  Weight gain that is not explained by a change in diet or exercise habits.  Dry skin.  Coarse hair.  Menstrual irregularity.  Slowing of thought processes.  Constipation.  Sadness or depression.  How is this diagnosed? Your health care provider may diagnose hypothyroidism with blood tests and ultrasound tests. How is this treated? Hypothyroidism is treated with medicine that replaces the hormones that your body does not make. After you begin treatment, it may take several weeks for symptoms to go away. Follow these instructions at home:  Take medicines only as directed by your health care provider.  If you start taking any new medicines, tell your health care provider.  Keep all follow-up visits as directed by your health care provider. This is important. As your condition improves, your dosage needs may change. You will need to have blood tests regularly so that your health care provider can watch your condition. Contact a health care provider if:  Your symptoms do not get better with treatment.  You are taking thyroid  replacement medicine and: ? You sweat excessively. ? You have tremors. ? You feel anxious. ? You lose weight rapidly. ? You cannot tolerate heat. ? You have emotional swings. ? You have diarrhea. ? You feel weak. Get help right away if:  You develop chest pain.  You develop an irregular heartbeat.  You develop a rapid heartbeat. This information is not intended to replace advice given to you by your health care provider. Make sure you discuss any questions you have with your health care provider. Document Released: 03/20/2005 Document Revised: 08/26/2015 Document Reviewed: 08/05/2013 Elsevier Interactive Patient Education  2017 Elsevier Inc.  

## 2016-07-20 NOTE — Progress Notes (Signed)
   Subjective:    Patient ID: Danielle [name], female    DOB: 17-Jun-1990, 26 y.o.   MRN: [id number]  HPI Pt is a 26 yo female who presents to the clinic to discuss elevated TSH on labs. She feels like she might be ready to start medication at this time. She has no energy, hx of IBS, frequent headaches. Headaches at times feel like "lighting bolts running down her head". She takes excedrin almost every day. heaaches and IBS seem to worsen with gluten. She wonders if she has celiac.    Review of Systems See HPI.     Objective:   Physical Exam  Constitutional: She is oriented to person, place, and time. She appears well-developed and well-nourished.  HENT:  Head: Normocephalic and atraumatic.  Eyes: Conjunctivae are normal. Right eye exhibits no discharge. Left eye exhibits no discharge.  Neck: Normal range of motion. Neck supple. Thyromegaly present.  Cardiovascular: Normal rate, regular rhythm and normal heart sounds.   Pulmonary/Chest: Effort normal and breath sounds normal.  Neurological: She is alert and oriented to person, place, and time.  Psychiatric: She has a normal mood and affect. Her behavior is normal.          Assessment & Plan:  Marland KitchenMarland KitchenDiagnoses and all orders for this visit:  Hypothyroidism, unspecified type -     levothyroxine (SYNTHROID, LEVOTHROID) 50 MCG tablet; Take 1 tablet (50 mcg total) by mouth daily. -     TSH -     T4, free -     Thyroid antibodies -     US SOFT TISSUE HEAD AND NECK; Future  Irritable bowel syndrome with both constipation and diarrhea -     TSH -     T4, free -     Thyroid antibodies -     Reticulin Antibodies, IGA w Reflex to Titer -     VITAMIN D 25 Hydroxy (Vit-D Deficiency, Fractures) -     B12 -     CBC with Differential/Platelet -     Ferritin  No energy -     TSH -     T4, free -     Thyroid antibodies -     Reticulin Antibodies, IGA w Reflex to Titer -     VITAMIN D 25 Hydroxy (Vit-D Deficiency, Fractures) -     B12 -      CBC with Differential/Platelet -     Ferritin  Frequent headaches -     TSH -     T4, free -     Thyroid antibodies -     Reticulin Antibodies, IGA w Reflex to Titer -     VITAMIN D 25 Hydroxy (Vit-D Deficiency, Fractures) -     B12 -     CBC with Differential/Platelet -     Ferritin  Thyromegaly -     US SOFT TISSUE HEAD AND NECK; Future   Started levothyroxine. Discussed side effects. Patient aware to take first thing in am.  Labs in 4-6 weeks with follow up after.  Interested to see if symptoms improve after thyroid corrected.  U/S of thyroid ordered.  Will check for celiac disease.  Fatigue panel ordered.  Discussed starting prevention medication for headaches/migraines.

## 2016-07-27 ENCOUNTER — Ambulatory Visit (INDEPENDENT_AMBULATORY_CARE_PROVIDER_SITE_OTHER): Payer: BLUE CROSS/BLUE SHIELD

## 2016-07-27 DIAGNOSIS — E010 Iodine-deficiency related diffuse (endemic) goiter: Secondary | ICD-10-CM

## 2016-07-27 DIAGNOSIS — E039 Hypothyroidism, unspecified: Secondary | ICD-10-CM

## 2016-09-05 LAB — CBC WITH DIFFERENTIAL/PLATELET
BASOS ABS: 50 {cells}/uL (ref 0–200)
Basophils Relative: 1 %
Eosinophils Absolute: 50 cells/uL (ref 15–500)
Eosinophils Relative: 1 %
HEMATOCRIT: 39 % (ref 35.0–45.0)
Hemoglobin: 12.8 g/dL (ref 11.7–15.5)
LYMPHS ABS: 1400 {cells}/uL (ref 850–3900)
LYMPHS PCT: 28 %
MCH: 30.3 pg (ref 27.0–33.0)
MCHC: 32.8 g/dL (ref 32.0–36.0)
MCV: 92.2 fL (ref 80.0–100.0)
MONO ABS: 250 {cells}/uL (ref 200–950)
MPV: 12.3 fL (ref 7.5–12.5)
Monocytes Relative: 5 %
NEUTROS PCT: 65 %
Neutro Abs: 3250 cells/uL (ref 1500–7800)
Platelets: 163 10*3/uL (ref 140–400)
RBC: 4.23 MIL/uL (ref 3.80–5.10)
RDW: 12.6 % (ref 11.0–15.0)
WBC: 5 10*3/uL (ref 3.8–10.8)

## 2016-09-05 LAB — FERRITIN: Ferritin: 37 ng/mL (ref 10–154)

## 2016-09-05 LAB — TSH: TSH: 1.1 mIU/L

## 2016-09-05 LAB — VITAMIN D 25 HYDROXY (VIT D DEFICIENCY, FRACTURES): Vit D, 25-Hydroxy: 51 ng/mL (ref 30–100)

## 2016-09-05 LAB — T4, FREE: Free T4: 1.5 ng/dL (ref 0.8–1.8)

## 2016-09-05 LAB — VITAMIN B12: VITAMIN B 12: 1560 pg/mL — AB (ref 200–1100)

## 2016-09-05 LAB — THYROID ANTIBODIES
Thyroglobulin Ab: <1 IU/mL (ref <2)
Thyroperoxidase Ab SerPl-aCnc: 1 IU/mL (ref <9)

## 2016-09-05 NOTE — Progress Notes (Signed)
Call pt: no antibodies against thyroid.  Celiac still pending.  No anemia.  Vitamin d looks great.  Thyroid looks great. Ok to send 6 months refill.  B12 level a little elevated cut current supplement in half.

## 2016-09-06 ENCOUNTER — Other Ambulatory Visit: Payer: Self-pay

## 2016-09-06 DIAGNOSIS — E039 Hypothyroidism, unspecified: Secondary | ICD-10-CM

## 2016-09-06 LAB — RETICULIN ANTIBODIES, IGA W TITER: RETICULIN AB, IGA: NEGATIVE

## 2016-09-06 MED ORDER — LEVOTHYROXINE SODIUM 50 MCG PO TABS: 50.0000 ug | ORAL_TABLET | Freq: Every day | ORAL | 1 refills | Status: DC

## 2016-09-25 ENCOUNTER — Ambulatory Visit (INDEPENDENT_AMBULATORY_CARE_PROVIDER_SITE_OTHER): Payer: BLUE CROSS/BLUE SHIELD | Admitting: Obstetrics & Gynecology

## 2016-09-25 ENCOUNTER — Encounter: Payer: Self-pay | Admitting: Obstetrics & Gynecology

## 2016-09-25 VITALS — BP 107/74 | HR 93 | Ht 60.0 in | Wt 114.0 lb

## 2016-09-25 DIAGNOSIS — K529 Noninfective gastroenteritis and colitis, unspecified: Secondary | ICD-10-CM | POA: Insufficient documentation

## 2016-09-25 DIAGNOSIS — Z01419 Encounter for gynecological examination (general) (routine) without abnormal findings: Secondary | ICD-10-CM

## 2016-09-25 DIAGNOSIS — Z113 Encounter for screening for infections with a predominantly sexual mode of transmission: Secondary | ICD-10-CM | POA: Diagnosis not present

## 2016-09-25 DIAGNOSIS — R102 Pelvic and perineal pain: Secondary | ICD-10-CM

## 2016-09-25 DIAGNOSIS — N942 Vaginismus: Secondary | ICD-10-CM | POA: Insufficient documentation

## 2016-09-25 NOTE — Progress Notes (Signed)
Subjective:     Danielle [name] is a 26 y.o. female here for a routine exam.  Current complaints: painful intercourse, fear of pelvic exam due to traumatic incident in ED at age 26.     Gynecologic History Patient's last menstrual period was 09/07/2016. Contraception: condoms Last Pap: never.   Obstetric History OB History  Gravida Para Term Preterm AB Living  0 0 0 0 0 0  SAB TAB Ectopic Multiple Live Births  0 0 0 0 0         The following portions of the patient's history were reviewed and updated as appropriate: allergies, current medications, past family history, past medical history, past social history, past surgical history and problem list.  Review of Systems Pertinent items noted in HPI and remainder of comprehensive ROS otherwise negative.    Objective:      Vitals:   09/25/16 1455  BP: 107/74  Pulse: 93  Weight: 114 lb (51.7 kg)  Height: 5' (1.524 m)   Vitals:  WNL General appearance: alert, cooperative and no distress  HEENT: Normocephalic, without obvious abnormality, atraumatic Eyes: negative Throat: lips, mucosa, and tongue normal; teeth and gums normal  Respiratory: Clear to auscultation bilaterally  CV: Regular rate and rhythm  Breasts:  Normal appearance, no masses or tenderness, no nipple retraction or dimpling  GI: Soft, non-tender; bowel sounds normal; no masses,  no organomegaly  GU: External Genitalia:  Tanner V, no lesion Urethra:  No prolapse   Vagina: Pink, normal rugae, no blood or discharge  Cervix: No CMT, no lesion  Uterus:  Normal size and contour, non tender  Adnexa: Normal, no masses, non tender  Musculoskeletal: No edema, redness or tenderness in the calves or thighs  Skin: No lesions or rash  Lymphatic: Axillary adenopathy: none     Psychiatric: Normal mood and behavior       Assessment:    Healthy female exam.   Vaginismus   Plan:   Pap smear GC/Chlam Referral to PT Arnette Norris([facility]) for pelvic PT  Was able to do  complete full pelvic exam using small Pederson speculum.  Very important to go slow and encourage relaxation. Pt loses insurance on July 10th and hopefully will get 2 PT visits in before insurance ends.

## 2016-09-26 NOTE — Addendum Note (Signed)
Addended by: Kathie DikeSOLA, Hollan Philipp J on: 09/26/2016 08:10 AM   Modules accepted: Orders

## 2016-09-27 LAB — CYTOLOGY - PAP
Chlamydia: NEGATIVE
DIAGNOSIS: NEGATIVE
Neisseria Gonorrhea: NEGATIVE

## 2016-10-03 ENCOUNTER — Encounter: Payer: Self-pay | Admitting: Physical Therapy

## 2016-10-03 ENCOUNTER — Ambulatory Visit: Payer: BLUE CROSS/BLUE SHIELD | Attending: Obstetrics & Gynecology | Admitting: Physical Therapy

## 2016-10-03 DIAGNOSIS — M6281 Muscle weakness (generalized): Secondary | ICD-10-CM | POA: Diagnosis present

## 2016-10-03 DIAGNOSIS — R252 Cramp and spasm: Secondary | ICD-10-CM | POA: Diagnosis not present

## 2016-10-03 NOTE — Patient Instructions (Addendum)
Guided pelvic floor meditaion by [employer] on You tube.  Do daily.   STRETCHING THE PELVIC FLOOR MUSCLES NO DILATOR  Supplies . Vaginal lubricant . Mirror (optional) . Gloves (optional) Positioning . Start in a semi-reclined position with your head propped up. Bend your knees and place your thumb or finger at the vaginal opening. Procedure . Apply a moderate amount of lubricant on the outer skin of your vagina, the labia minora.  Apply additional lubricant to your finger. Marland Kitchen. Spread the skin away from the vaginal opening. Place the end of your finger at the opening. . Do a maximum contraction of the pelvic floor muscles. Tighten the vagina and the anus maximally and relax. . When you know they are relaxed, gently and slowly insert your finger into your vagina, directing your finger slightly downward, for 2-3 inches of insertion. . Relax and stretch the 6 o'clock position . Hold each stretch for _2 min__ and repeat __1_ time with rest breaks of _1__ seconds between each stretch. . Repeat the stretching in the 4 o'clock and 8 o'clock positions. . Total time should be _6__ minutes, _1__ x per day.  Note the amount of theme your were able to achieve and your tolerance to your finger in your vagina. . Once you have accomplished the techniques you may try them in standing with one foot resting on the tub, or in other positions.  This is a good stretch to do in the shower if you don't need to use lubricant.  . Then do a circular massage around the vulva( around where the tampon goes)   Hook-Lying    Lie with hips and knees bent. Allow body's muscles to relax. Place hands on belly. Inhale slowly and deeply for _3__ seconds, so hands move up. Then take _3__ seconds to exhale. Then bear down like you are pushing a ball out of the vagina.  Repeat _10__ times. Do __2_ times a day.   Copyright  VHI. All rights reserved.  Hip Adductor: Wall Stretch    Lie on back with hips against wall, back of  thighs on wall. Pull legs apart until stretch is felt in inner thighs. Hold ___ seconds. Relax. Repeat ___ times. Do ___ times a day. Advanced: At end of stretch, rotate thighs outward.  Copyright  VHI. All rights reserved.  Hip Adductor: Wall Stretch    Lie on back with hips against wall, back of thighs on wall. Hold 1 min. Then Pull legs apart until stretch is felt in inner thighs. Hold _60__ seconds. Relax. Repeat _2__ times. Do _1__ times a day. Advanced: At end of stretch, rotate thighs outward.  Copyright  VHI. All rights reserved.  Butterfly, Supine    Lie on back, feet together. Lower knees toward floor. Hold _60__ seconds. Repeat _2__ times per session. Do _1__ sessions per day.  Copyright  VHI. All rights reserved.  Mid-Back Stretch    Push chest toward floor, keep knees apart, reaching forward as far as possible. Hold _60___ seconds. Repeat _2___ times per set. Do __1__ sets per session. Do __1__ sessions per day.  http://orth.exer.us/130   Copyright  VHI. All rights reserved.  Angry Cat Stretch    Tuck chin and tighten stomach, arching back. Repeat _15___ times per set. Do __1__ sets per session. Do __1__ sessions per day.  http://orth.exer.us/118   Copyright  VHI. All rights reserved.  [facility] [address], [address] [place], [place] [address] Phone # [phone] Fax [phone]
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
## 2016-10-12 ENCOUNTER — Ambulatory Visit: Payer: BLUE CROSS/BLUE SHIELD | Admitting: Physical Therapy

## 2016-10-17 ENCOUNTER — Encounter: Payer: Self-pay | Admitting: Physical Therapy

## 2016-10-17 ENCOUNTER — Ambulatory Visit: Payer: BLUE CROSS/BLUE SHIELD | Admitting: Physical Therapy

## 2016-10-17 DIAGNOSIS — M6281 Muscle weakness (generalized): Secondary | ICD-10-CM

## 2016-10-17 DIAGNOSIS — R252 Cramp and spasm: Secondary | ICD-10-CM | POA: Diagnosis not present
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
## 2016-10-17 NOTE — Patient Instructions (Signed)
Prickly ball roller or flat roller to rub on thighs  Ball to sit on and roll on the perineum  Earphones to listen to pelvic floor meditation  [facility] [address], [address] [place], [place] [address] Phone # [phone] Fax [phone]

## 2016-10-24 ENCOUNTER — Ambulatory Visit: Payer: BLUE CROSS/BLUE SHIELD | Admitting: Physical Therapy

## 2016-10-24 ENCOUNTER — Encounter: Payer: Self-pay | Admitting: Physical Therapy

## 2016-10-24 DIAGNOSIS — R252 Cramp and spasm: Secondary | ICD-10-CM

## 2016-10-24 DIAGNOSIS — M6281 Muscle weakness (generalized): Secondary | ICD-10-CM

## 2016-10-24 NOTE — Patient Instructions (Signed)
[email or web address]  [email or web address]  [facility] [address], [address] [place], [place] [address] Phone # [phone] Fax [phone]

## 2016-10-24 NOTE — Therapy (Signed)
[facility] [address], [address] [place], [place], [address] Phone: [phone]   Fax:  [phone]  Physical Therapy Treatment  Patient Details  Name: Danielle [name] MRN: [id number] Date of Birth: 1990-10-25 Referring Provider: Dr. Elsie Lincoln  Encounter Date: 10/24/2016      PT End of Session - 10/24/16 1702    Visit Number 3   Date for PT Re-Evaluation 11/28/16   Authorization Type [employer] 30 visit limit but has had PT at another facility   Authorization - Visit Number 3   Authorization - Number of Visits 28   PT Start Time 1615   PT Stop Time 1700   PT Time Calculation (min) 45 min   Activity Tolerance Patient tolerated treatment well   Behavior During Therapy [facility] for tasks assessed/performed      Past Medical History:  Diagnosis Date  . Hypothyroidism     Past Surgical History:  Procedure Laterality Date  . UPPER GI ENDOSCOPY    . WISDOM TOOTH EXTRACTION      There were no vitals filed for this visit.      Subjective Assessment - 10/24/16 1619    Subjective I felt good after last visit. I have not had pain besides this week.  I was intimate with my husband but no sex and was manageable. My husband will help me with the perineal massage.    Patient Stated Goals be able to have intercourse increased comfort   Currently in Pain? Yes   Pain Score 3    Pain Location Vagina   Pain Orientation Mid   Pain Descriptors / Indicators Tightness  pinching   Pain Type Chronic pain   Pain Onset More than a month ago   Pain Frequency Intermittent   Aggravating Factors  intercourse, vaginal exam, first day of menstrual cycle unable to do anything   Pain Relieving Factors no times in the vaginal area   Multiple Pain Sites No            [facility] PT Assessment - 10/24/16 0001      Palpation   SI assessment  pelvis in correct alignment                  Pelvic Floor Special Questions - 10/24/16 0001     Pelvic Floor Internal Exam Patient confirmed identification and approved therapist to perform soft tissue work   Exam Type Vaginal  out side   Palpation attempted to place therapist index finger into the vaginal canal but to painful and muscles were not able to relax   Tone increased tone           [facility] Adult PT Treatment/Exercise - 10/24/16 0001      Self-Care   Self-Care Other Self-Care Comments   Other Self-Care Comments  how to use a q-tip around the introitus and into the introitus ; gentle soft tissue work around the outside perineum     Manual Therapy   Manual Therapy Myofascial release   Myofascial Release outside the perineum to release the fascia and improve the opening of the introitus                PT Education - 10/24/16 1658    Education provided Yes   Education Details discussed how to work with her husband on gentle soft tissue work on the outside perineum, websites she is able to look at   [employer]) Educated Patient   Methods Explanation   Comprehension  Verbalized understanding          PT Short Term Goals - 10/24/16 1705      PT SHORT TERM GOAL #1   Title independent with initial HEP for flexibility and deep breathing   Time 4   Period Weeks   Status Achieved     PT SHORT TERM GOAL #2   Title pain with daily tasks decreased >/= 25% due to increased tissue mobility   Time 4   Status On-going     PT SHORT TERM GOAL #3   Title understand how to use guided meditation for the pelvic floor to learn how to relax the muscles   Time 4   Period Weeks   Status On-going           PT Long Term Goals - 10/03/16 1140      PT LONG TERM GOAL #1   Title independent with HEP   Time 8   Period Weeks   Status New     PT LONG TERM GOAL #2   Title ability to have intercourse with her husband with minimal discomfort and anxiety   Time 8   Period Weeks   Status New     PT LONG TERM GOAL #3   Title pain with daily activities decreased >/=  50% due to understanding how to stretch and strengthen   Time 8   Period Weeks   Status New               Plan - 10/24/16 1702    Clinical Impression Statement Patient is not able to have intercourse but she was able to have her husband to touch her externally.  Patient is doing her stretches and exercises at home.  Patient was able to tolerate the therapist to perform external myofascial release for the first time.  Patient will benefit from skilled therapy to improve overall strength while decreasing her apin and muscle tightness.    Rehab Potential Excellent   PT Frequency 1x / week   PT Duration 8 weeks   PT Treatment/Interventions Biofeedback;Cryotherapy;Electrical Stimulation;Moist Heat;Ultrasound;Therapeutic activities;Therapeutic exercise;Neuromuscular re-education;Patient/family education;Manual techniques;Dry needling   PT Next Visit Plan soft tissue work to muscles; try external perineal work   PT Home Exercise Plan progress as needed   Consulted and Agree with Plan of Care Patient      Patient will benefit from skilled therapeutic intervention in order to improve the following deficits and impairments:  Increased fascial restricitons, Impaired tone, Decreased endurance, Increased muscle spasms, Pain, Decreased activity tolerance, Impaired flexibility, Hypomobility, Decreased strength  Visit Diagnosis: Cramp and spasm  Muscle weakness (generalized)     Problem List Patient Active Problem List   Diagnosis Date Noted  . Inflammatory bowel disease 09/25/2016  . 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 No energy 07/20/2016  . Thyromegaly 07/20/2016  . Panic attack 05/15/2016  . Hypothyroidism 11/11/2014  . Vitamin D deficiency 11/11/2014  . Frequent headaches 10/30/2014  . Facial numbness 10/30/2014  . Leg numbness 10/30/2014  . Generalized anxiety disorder 10/30/2014  . Dyspareunia 04/24/2012  .  Depression 01/04/2012  . Thrombocytopenia (HCC) 08/17/2011    Eulis Fosterheryl Gray, PT 10/24/16 5:06 PM   [facility] [address], [address] [place], [place], [address] Phone: [phone]   Fax:  [phone]  Name: Danielle [name] MRN: [id number] Date of Birth: 1990/12/23

## 2016-10-31 ENCOUNTER — Ambulatory Visit: Payer: BLUE CROSS/BLUE SHIELD | Admitting: Physical Therapy

## 2016-10-31 ENCOUNTER — Encounter: Payer: Self-pay | Admitting: Physical Therapy

## 2016-10-31 DIAGNOSIS — M6281 Muscle weakness (generalized): Secondary | ICD-10-CM

## 2016-10-31 DIAGNOSIS — R252 Cramp and spasm: Secondary | ICD-10-CM | POA: Diagnosis not present

## 2016-10-31 NOTE — Therapy (Addendum)
[facility] [address], [place], [place], [address] Phone: [phone]   Fax:  [phone]  Physical Therapy Treatment  Patient Details  Name: Danielle [name] MRN: [id number] Date of Birth: 21-Oct-1990 Referring Provider: Dr. Silas [place]  Encounter Date: 10/31/2016      PT End of Session - 10/31/16 1317    Visit Number 4   Date for PT Re-Evaluation 11/28/16   Authorization Type [employer] 30 visit limit but has had PT at another facility   Authorization - Visit Number 4   Authorization - Number of Visits 28   PT Start Time 1237   PT Stop Time 1315   PT Time Calculation (min) 38 min   Activity Tolerance Patient tolerated treatment well   Behavior During Therapy [facility] for tasks assessed/performed      Past Medical History:  Diagnosis Date  . Hypothyroidism     Past Surgical History:  Procedure Laterality Date  . UPPER GI ENDOSCOPY    . WISDOM TOOTH EXTRACTION      There were no vitals filed for this visit.      Subjective Assessment - 10/31/16 1242    Subjective My insurance runs out today so I need to see if I will be able to come to therapy. I was alittle sore after last session.    Patient Stated Goals be able to have intercourse increased comfort   Currently in Pain? No/denies                                 PT Education - 10/31/16 1315    Education provided Yes   Education Details reviewed HEP, added yoga you tube videos she can watch, what to do with soft tissue work, hip strengthening, abdominal strength   Person(s) Educated Patient   Methods Explanation;Demonstration;Verbal cues;Handout   Comprehension Returned demonstration;Verbalized understanding          PT Short Term Goals - 10/31/16 1244      PT SHORT TERM GOAL #1   Title independent with initial HEP for flexibility and deep breathing   Time 4   Period Weeks   Status Achieved     PT SHORT TERM GOAL #2   Title pain with daily tasks decreased >/= 25% due to increased tissue mobility   Time 4   Period Weeks   Status Achieved     PT SHORT TERM GOAL #3   Title understand how to use guided meditation for the pelvic floor to learn how to relax the muscles   Time 4   Period Weeks   Status Achieved  misplaced the paper.           PT Long Term Goals - 10/03/16 1140      PT LONG TERM GOAL #1   Title independent with HEP   Time 8   Period Weeks   Status New     PT LONG TERM GOAL #2   Title ability to have intercourse with her husband with minimal discomfort and anxiety   Time 8   Period Weeks   Status New     PT LONG TERM GOAL #3   Title pain with daily activities decreased >/= 50% due to understanding how to stretch and strengthen   Time 8   Period Weeks   Status New               Plan -  10/31/16 1318    Clinical Impression Statement Patient has met her STG's.  Patient pelvis in correct alignment.  Patient is 25% decreased in everyday pain. Patient still has pain with intercourse.  Patient is not comfortable to perform self soft tissue work to the perineum yet.  Patient insurance has run out so she is seeing ways to come to therapy after she talks to accounting. Patient may be getting a new job which may be able to assist her in payments. Patient continues to have weakness in her hips and abdoment.  Patient will benefit from skilled therapy to improve overall strength while decreasing her pain and muscle tightness.    Rehab Potential Excellent   PT Frequency 1x / week   PT Duration 8 weeks   PT Treatment/Interventions Biofeedback;Cryotherapy;Electrical Stimulation;Moist Heat;Ultrasound;Therapeutic activities;Therapeutic exercise;Neuromuscular re-education;Patient/family education;Manual techniques;Dry needling   PT Next Visit Plan soft tissue work; review HEP   PT Home Exercise Plan progress as needed   Consulted and Agree with Plan of Care Patient      Patient will  benefit from skilled therapeutic intervention in order to improve the following deficits and impairments:  Increased fascial restricitons, Impaired tone, Decreased endurance, Increased muscle spasms, Pain, Decreased activity tolerance, Impaired flexibility, Hypomobility, Decreased strength  Visit Diagnosis: Cramp and spasm  Muscle weakness (generalized)     Problem List Patient Active Problem List   Diagnosis Date Noted  . Inflammatory bowel disease 09/25/2016  . 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 No energy 07/20/2016  . Thyromegaly 07/20/2016  . Panic attack 05/15/2016  . Hypothyroidism 11/11/2014  . Vitamin D deficiency 11/11/2014  . Frequent headaches 10/30/2014  . Facial numbness 10/30/2014  . Leg numbness 10/30/2014  . Generalized anxiety disorder 10/30/2014  . Dyspareunia 04/24/2012  . Depression 01/04/2012  . Thrombocytopenia ([place]) 08/17/2011    Earlie Counts, PT 10/31/16 1:26 PM   [facility] [address] [place], [place], [address] Phone: [phone]   Fax:  [phone]  Name: Chyrel Taha MRN: [id number] Date of Birth: 12-Sep-1990  PHYSICAL THERAPY DISCHARGE SUMMARY  Visits from Start of Care: 4  Current functional level related to goals / functional outcomes: See above.  Patient stopped coming due to financial reasons.    Remaining deficits: See above.    Education / Equipment: HEP Plan: Patient agrees to discharge.  Patient goals were not met. Patient is being discharged due to financial reasons. Thank you for the referral. Earlie Counts, PT 11/15/16 10:55 AM   ?????

## 2016-10-31 NOTE — Patient Instructions (Addendum)
Guided Meditation for Pelvic Floor Relaxation  [employer] on You tube  Alternate each day with yoga or strengthening.  Stretch everyday.  Every day do pelvic tissue work- use fingers to massage the perineum and use roller pin to massage the legs Strengthening: Hip Abduction (Side-Lying)    Tighten muscles on front of left thigh, then lift leg _6___ inches from surface, keeping knee locked.  Repeat __5__ times per set. Do _1___ sets per session. Do _1___ sessions per day.  http://orth.exer.us/622   Copyright  VHI. All rights reserved.  Strengthening: Hip Adduction (Side-Lying)    Tighten muscles on front of right thigh, then lift leg _2___ inches from surface, keeping knee locked.  Repeat __5__ times per set. Do _1___ sets per session. Do _1___ sessions per day.  http://orth.exer.us/624   Copyright  VHI. All rights reserved.    Bracing With Leg March (Hook-Lying)    With neutral spine, tighten pelvic floor and abdominals and hold. Alternating legs, lift foot _6__ inches and return to floor. Repeat _5__ times. Do _1__ times a day.   Copyright  VHI. All rights reserved.  Bracing With Knee Fallout (Hook-Lying)    With neutral spine, tighten pelvic floor and abdominals and hold. Alternating legs, drop knee out to side. Keep opposite hip still. Repeat _5__ times. Do _1__ times a day.   Copyright  VHI. All rights reserved.  You Tube Yoga to follow [employer] Shelly Prosko, PT [employer]  [employer] on you tube. (everybody yoga)  [email or web address]  Tightly wound   Use rolling pin to massage muscle and ball.   Guided pelvic floor meditaion by [employer] on You tube.  Do daily.   STRETCHING THE PELVIC FLOOR MUSCLES NO DILATOR  Supplies  Vaginal lubricant  Mirror (optional)  Gloves (optional) Positioning  Start in a semi-reclined position with your head propped up. Bend your knees and place your thumb or finger at the vaginal  opening. Procedure  Apply a moderate amount of lubricant on the outer skin of your vagina, the labia minora.  Apply additional lubricant to your finger.  Spread the skin away from the vaginal opening. Place the end of your finger at the opening.  Do a maximum contraction of the pelvic floor muscles. Tighten the vagina and the anus maximally and relax.  When you know they are relaxed, gently and slowly insert your finger into your vagina, directing your finger slightly downward, for 2-3 inches of insertion.  Relax and stretch the 6 o'clock position  Hold each stretch for _2 min__ and repeat __1_ time with rest breaks of _1__ seconds between each stretch.  Repeat the stretching in the 4 o'clock and 8 o'clock positions.  Total time should be _6__ minutes, _1__ x per day.  Note the amount of theme your were able to achieve and your tolerance to your finger in your vagina.  Once you have accomplished the techniques you may try them in standing with one foot resting on the tub, or in other positions.  This is a good stretch to do in the shower if you don't need to use lubricant.   Then do a circular massage around the vulva( around where the tampon goes)   Hook-Lying    Lie with hips and knees bent. Allow body's muscles to relax. Place hands on belly. Inhale slowly and deeply for _3__ seconds, so hands move up. Then take _3__ seconds to exhale. Then bear down like you are pushing a ball  out of the vagina.  Repeat _10__ times. Do __2_ times a day.   Copyright  VHI. All rights reserved.  Hip Adductor: Wall Stretch    Lie on back with hips against wall, back of thighs on wall. Pull legs apart until stretch is felt in inner thighs. Hold ___ seconds. Relax. Repeat ___ times. Do ___ times a day. Advanced: At end of stretch, rotate thighs outward.  Copyright  VHI. All rights reserved.  Hip Adductor: Wall Stretch    Lie on back with hips against wall, back of thighs on wall. Hold 1  min. Then Pull legs apart until stretch is felt in inner thighs. Hold _60__ seconds. Relax. Repeat _2__ times. Do _1__ times a day. Advanced: At end of stretch, rotate thighs outward.  Copyright  VHI. All rights reserved.  Butterfly, Supine    Lie on back, feet together. Lower knees toward floor. Hold _60__ seconds. Repeat _2__ times per session. Do _1__ sessions per day.  Copyright  VHI. All rights reserved.  Mid-Back Stretch    Push chest toward floor, keep knees apart, reaching forward as far as possible. Hold _60___ seconds. Repeat _2___ times per set. Do __1__ sets per session. Do __1__ sessions per day.  http://orth.exer.us/130   Copyright  VHI. All rights reserved.  Angry Cat Stretch    Tuck chin and tighten stomach, arching back. Repeat _15___ times per set. Do __1__ sets per session. Do __1__ sessions per day.  http://orth.exer.us/118   [facility] [address], [address] [place], [place] [address] Phone # [phone] Fax [phone]

## 2016-11-16 ENCOUNTER — Other Ambulatory Visit: Payer: Self-pay | Admitting: Physician Assistant

## 2016-11-16 DIAGNOSIS — F411 Generalized anxiety disorder: Secondary | ICD-10-CM

## 2016-12-27 ENCOUNTER — Ambulatory Visit (INDEPENDENT_AMBULATORY_CARE_PROVIDER_SITE_OTHER): Payer: BLUE CROSS/BLUE SHIELD | Admitting: Physician Assistant

## 2016-12-27 ENCOUNTER — Encounter: Payer: Self-pay | Admitting: Physician Assistant

## 2016-12-27 VITALS — BP 102/57 | HR 68 | Ht 60.0 in | Wt 114.0 lb

## 2016-12-27 DIAGNOSIS — F411 Generalized anxiety disorder: Secondary | ICD-10-CM

## 2016-12-27 DIAGNOSIS — E039 Hypothyroidism, unspecified: Secondary | ICD-10-CM

## 2016-12-27 DIAGNOSIS — F410 Panic disorder [episodic paroxysmal anxiety] without agoraphobia: Secondary | ICD-10-CM

## 2016-12-27 DIAGNOSIS — R002 Palpitations: Secondary | ICD-10-CM

## 2016-12-27 LAB — TSH: TSH: 0.43 mIU/L

## 2016-12-27 MED ORDER — CLONAZEPAM 0.5 MG PO TABS: ORAL_TABLET | ORAL | 3 refills | Status: DC

## 2016-12-27 NOTE — Progress Notes (Addendum)
   Subjective:    Patient ID: Danielle [name], female    DOB: 10-18-1990, 26 y.o.   MRN: [id number]  HPI The patient is a 26 yo female patient who presents today for a follow up on her Klonopin. She states that does not need the Klonopin every day. She says that she can go two weeks without a flare of panic attacks. During her flares she takes one tablet of Klonopin three times per week. She believes triggers of panic attacks may include headaches and hunger. For headaches she takes Excedrin migraine.  She reports she was going to physical therapy but she stopped because of cost. She has not been doing yoga daily as she had been in the past, but she notices that her anxiety is better when she is doing yoga. She reported one episode of palpitations, which resolved spontaneously.   She is taking her levothyroxine as prescribed for hypothyroidism, but has not noticed a difference since starting. She states she will continue to take it as prescribed.   .. Active Ambulatory Problems    Diagnosis Date Noted  . Frequent headaches 10/30/2014  . Facial numbness 10/30/2014  . Leg numbness 10/30/2014  . Generalized anxiety disorder 10/30/2014  . Hypothyroidism 11/11/2014  . Vitamin D deficiency 11/11/2014  . Panic attack 05/15/2016  . 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 No energy 07/20/2016  . Thyromegaly 07/20/2016  . Depression 01/04/2012  . Dyspareunia 04/24/2012  . Inflammatory bowel disease 09/25/2016  . Thrombocytopenia ([facility]) 08/17/2011  . 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 Palpitation 12/28/2016   Resolved Ambulatory Problems    Diagnosis Date Noted  . No Resolved Ambulatory Problems   Past Medical History:  Diagnosis Date  . Hypothyroidism        Review of Systems  All other systems reviewed and are negative.      Objective:   Physical Exam  Constitutional: She is oriented to person, place, and time. She appears well-developed and  well-nourished.  HENT:  Head: Normocephalic and atraumatic.  Neck: Normal range of motion. Neck supple.  Cardiovascular: Normal rate, regular rhythm and normal heart sounds.   Pulmonary/Chest: Effort normal and breath sounds normal.  Lymphadenopathy:    She has no cervical adenopathy.  Neurological: She is alert and oriented to person, place, and time.  Psychiatric: She has a normal mood and affect. Her behavior is normal.          Assessment & Plan:  Marland KitchenMarland KitchenHayley was seen today for anxiety.  Diagnoses and all orders for this visit:  Generalized anxiety disorder -     clonazePAM (KLONOPIN) 0.5 MG tablet; TAKE 1/2 TO 1 TABLET BY MOUTH EVERY DAY AS NEEDED FOR ANXIETY. -     TSH  Panic attack  Hypothyroidism, unspecified type  Palpitation  .Marland Kitchen GAD 7 : Generalized Anxiety Score 12/27/2016  Nervous, Anxious, on Edge 2  Control/stop worrying 1  Worry too much - different things 1  Trouble relaxing 2  Restless 1  Easily annoyed or irritable 2  Afraid - awful might happen 0  Total GAD 7 Score 9      We will refill her Klonopin today. She was given a handout on PVC's and instructed to keep a diary of when the PVC's occur. Since it has only happened once I am not overly concerned. Discussed things that can cause PvC's. Will check TSH and adjust accordingly. Follow up if symptoms worsening.

## 2016-12-27 NOTE — Patient Instructions (Signed)
Premature Ventricular Contraction A premature ventricular contraction (PVC) is a common irregularity in the normal heart rhythm. These contractions are extra heartbeats that start in the heart ventricles and occur too early in the normal sequence. During the PVC, the heart's normal electrical pathway is not used, so the beat is shorter and less effective. In most cases, these contractions come and go and do not require treatment. What are the causes? In many cases, the cause may not be known. Common causes of the condition include:  Smoking.  Drinking alcohol.  Caffeine.  Certain medicines.  Some illegal drugs.  Stress.  Certain medical conditions can also cause PVCs:  Changes in minerals in the blood (electrolytes).  Heart failure.  Heart valve problems.  Low blood oxygen levels or high carbon dioxide levels.  Heart attack, or coronary artery disease.  What are the signs or symptoms? The main symptom of this condition is a fast or skipped heartbeat (palpitations). Other symptoms include:  Chest pain.  Shortness of breath.  Feeling tired.  Dizziness.  In some cases, there are no symptoms. How is this diagnosed? This condition may be diagnosed based on:  Your medical history.  A physical exam. During the exam, the health care provider will check for irregular heartbeats.  Tests, such as: ? An ECG (electrocardiogram) to monitor the electrical activity of your heart. ? Holter monitor testing. This involves wearing a device that clips to your clothing and monitors the electrical activity of your heart over longer periods of time. ? Stress tests to see how exercise affects your heart rhythm and blood supply. ? Echocardiogram. This test uses sound waves (ultrasound) to produce an image of your heart. ? Electrophysiology study. This test checks the electric pathways in your heart.  How is this treated? Treatment depends on any underlying conditions, the type of PVCs  that you are having, and how much the symptoms are interfering with your daily life. Possible treatments include:  Avoiding things that can trigger the premature contractions, such as caffeine or alcohol.  Medicines. These may be given if symptoms are severe or if the extra heartbeats are frequent.  Treatment for any underlying condition that is found to be the cause of the contractions.  Catheter ablation. This procedure destroys the heart tissues that send abnormal signals.  In some cases, no treatment is required. Follow these instructions at home: Lifestyle Follow these instructions as told by your health care provider:  Do not use any products that contain nicotine or tobacco, such as cigarettes and e-cigarettes. If you need help quitting, ask your health care provider.  If caffeine triggers episodes of PVC, do not eat, drink, or use anything with caffeine in it.  If caffeine does not seem to trigger episodes, consume caffeine in moderation.  If alcohol triggers episodes of PVC, do not drink alcohol.  If alcohol does not seem to trigger episodes, limit alcohol intake to no more than 1 drink a day for nonpregnant women and 2 drinks a day for men. One drink equals 12 oz of beer, 5 oz of wine, or 1 oz of hard liquor.  Exercise regularly. Ask your health care provider what type of exercise is safe for you.  Find healthy ways to manage stress. Avoid stressful situations when possible.  Try to get at least 7-9 hours of sleep each night, or as much as recommended by your health care provider.  Do not use illegal drugs.  General instructions  Take over-the-counter and prescription medicines only   as told by your health care provider.  Keep all follow-up visits as told by your health care provider. This is important. Get help right away if:  You feel palpitations that are frequent or continual.  You have chest pain.  You have shortness of breath.  You have sweating for no  reason.  You have nausea and vomiting.  You become light-headed or you faint. This information is not intended to replace advice given to you by your health care provider. Make sure you discuss any questions you have with your health care provider. Document Released: 11/05/2003 Document Revised: 11/12/2015 Document Reviewed: 08/25/2015 Elsevier Interactive Patient Education  2018 Elsevier Inc.  

## 2016-12-28 ENCOUNTER — Other Ambulatory Visit: Payer: Self-pay

## 2016-12-28 ENCOUNTER — Telehealth: Payer: Self-pay

## 2016-12-28 DIAGNOSIS — E039 Hypothyroidism, unspecified: Secondary | ICD-10-CM

## 2016-12-28 DIAGNOSIS — R002 Palpitations: Secondary | ICD-10-CM | POA: Insufficient documentation

## 2016-12-28 MED ORDER — LEVOTHYROXINE SODIUM 25 MCG PO TABS: 25.0000 ug | ORAL_TABLET | Freq: Every day | ORAL | 2 refills | Status: DC

## 2017-03-05 ENCOUNTER — Telehealth: Payer: Self-pay | Admitting: *Deleted

## 2017-03-05 DIAGNOSIS — E039 Hypothyroidism, unspecified: Secondary | ICD-10-CM

## 2017-03-05 NOTE — Telephone Encounter (Signed)
TSH ordered to recheck levels.

## 2017-03-07 ENCOUNTER — Other Ambulatory Visit: Payer: Self-pay | Admitting: Physician Assistant

## 2017-03-07 LAB — TSH: TSH: 4.59 mIU/L — ABNORMAL HIGH

## 2017-03-13 ENCOUNTER — Other Ambulatory Visit: Payer: Self-pay | Admitting: *Deleted

## 2017-03-13 DIAGNOSIS — E039 Hypothyroidism, unspecified: Secondary | ICD-10-CM

## 2017-03-13 MED ORDER — LEVOTHYROXINE SODIUM 50 MCG PO TABS: 50.0000 ug | ORAL_TABLET | Freq: Every day | ORAL | 1 refills | Status: DC

## 2017-03-13 MED ORDER — LEVOTHYROXINE SODIUM 25 MCG PO TABS: 25.0000 ug | ORAL_TABLET | Freq: Every day | ORAL | 1 refills | Status: DC

## 2017-03-13 NOTE — Progress Notes (Signed)
6 weeks

## 2017-04-30 ENCOUNTER — Telehealth: Payer: Self-pay | Admitting: Emergency Medicine

## 2017-04-30 NOTE — Telephone Encounter (Signed)
Thank you :)

## 2017-04-30 NOTE — Telephone Encounter (Signed)
Patient calling to schedule thyroid labs this week. I told her order would be placed and ready for her. pk

## 2017-05-01 LAB — TSH: TSH: 2.51 m[IU]/L

## 2017-05-04 ENCOUNTER — Other Ambulatory Visit: Payer: Self-pay | Admitting: *Deleted

## 2017-05-04 DIAGNOSIS — E039 Hypothyroidism, unspecified: Secondary | ICD-10-CM

## 2017-05-04 MED ORDER — LEVOTHYROXINE SODIUM 50 MCG PO TABS: 50.0000 ug | ORAL_TABLET | Freq: Every day | ORAL | 5 refills | Status: DC

## 2017-05-04 MED ORDER — LEVOTHYROXINE SODIUM 25 MCG PO TABS: 25.0000 ug | ORAL_TABLET | Freq: Every day | ORAL | 5 refills | Status: DC

## 2017-05-24 ENCOUNTER — Other Ambulatory Visit: Payer: Self-pay | Admitting: Physician Assistant

## 2017-05-24 ENCOUNTER — Other Ambulatory Visit: Payer: Self-pay | Admitting: *Deleted

## 2017-05-24 DIAGNOSIS — E039 Hypothyroidism, unspecified: Secondary | ICD-10-CM

## 2017-05-24 MED ORDER — LEVOTHYROXINE SODIUM 50 MCG PO TABS: 50.0000 ug | ORAL_TABLET | Freq: Every day | ORAL | 5 refills | Status: DC

## 2017-07-13 ENCOUNTER — Other Ambulatory Visit: Payer: Self-pay | Admitting: Physician Assistant

## 2017-07-13 DIAGNOSIS — F411 Generalized anxiety disorder: Secondary | ICD-10-CM

## 2017-09-10 ENCOUNTER — Ambulatory Visit (INDEPENDENT_AMBULATORY_CARE_PROVIDER_SITE_OTHER): Payer: Self-pay | Admitting: Physician Assistant

## 2017-09-10 ENCOUNTER — Encounter: Payer: Self-pay | Admitting: Physician Assistant

## 2017-09-10 VITALS — BP 112/69 | HR 65 | Wt 103.0 lb

## 2017-09-10 DIAGNOSIS — R002 Palpitations: Secondary | ICD-10-CM

## 2017-09-10 DIAGNOSIS — F411 Generalized anxiety disorder: Secondary | ICD-10-CM

## 2017-09-10 DIAGNOSIS — E039 Hypothyroidism, unspecified: Secondary | ICD-10-CM

## 2017-09-10 MED ORDER — CLONAZEPAM 0.5 MG PO TABS: ORAL_TABLET | ORAL | 5 refills | Status: DC

## 2017-09-10 MED ORDER — CLONAZEPAM 0.5 MG PO TABS
ORAL_TABLET | ORAL | 5 refills | Status: DC
Start: 2017-09-10 — End: 2017-09-10

## 2017-09-10 NOTE — Patient Instructions (Signed)
Premature Ventricular Contraction A premature ventricular contraction (PVC) is a common irregularity in the normal heart rhythm. These contractions are extra heartbeats that start in the heart ventricles and occur too early in the normal sequence. During the PVC, the heart's normal electrical pathway is not used, so the beat is shorter and less effective. In most cases, these contractions come and go and do not require treatment. What are the causes? In many cases, the cause may not be known. Common causes of the condition include:  Smoking.  Drinking alcohol.  Caffeine.  Certain medicines.  Some illegal drugs.  Stress.  Certain medical conditions can also cause PVCs:  Changes in minerals in the blood (electrolytes).  Heart failure.  Heart valve problems.  Low blood oxygen levels or high carbon dioxide levels.  Heart attack, or coronary artery disease.  What are the signs or symptoms? The main symptom of this condition is a fast or skipped heartbeat (palpitations). Other symptoms include:  Chest pain.  Shortness of breath.  Feeling tired.  Dizziness.  In some cases, there are no symptoms. How is this diagnosed? This condition may be diagnosed based on:  Your medical history.  A physical exam. During the exam, the health care provider will check for irregular heartbeats.  Tests, such as: ? An ECG (electrocardiogram) to monitor the electrical activity of your heart. ? Holter monitor testing. This involves wearing a device that clips to your clothing and monitors the electrical activity of your heart over longer periods of time. ? Stress tests to see how exercise affects your heart rhythm and blood supply. ? Echocardiogram. This test uses sound waves (ultrasound) to produce an image of your heart. ? Electrophysiology study. This test checks the electric pathways in your heart.  How is this treated? Treatment depends on any underlying conditions, the type of PVCs  that you are having, and how much the symptoms are interfering with your daily life. Possible treatments include:  Avoiding things that can trigger the premature contractions, such as caffeine or alcohol.  Medicines. These may be given if symptoms are severe or if the extra heartbeats are frequent.  Treatment for any underlying condition that is found to be the cause of the contractions.  Catheter ablation. This procedure destroys the heart tissues that send abnormal signals.  In some cases, no treatment is required. Follow these instructions at home: Lifestyle Follow these instructions as told by your health care provider:  Do not use any products that contain nicotine or tobacco, such as cigarettes and e-cigarettes. If you need help quitting, ask your health care provider.  If caffeine triggers episodes of PVC, do not eat, drink, or use anything with caffeine in it.  If caffeine does not seem to trigger episodes, consume caffeine in moderation.  If alcohol triggers episodes of PVC, do not drink alcohol.  If alcohol does not seem to trigger episodes, limit alcohol intake to no more than 1 drink a day for nonpregnant women and 2 drinks a day for men. One drink equals 12 oz of beer, 5 oz of wine, or 1 oz of hard liquor.  Exercise regularly. Ask your health care provider what type of exercise is safe for you.  Find healthy ways to manage stress. Avoid stressful situations when possible.  Try to get at least 7-9 hours of sleep each night, or as much as recommended by your health care provider.  Do not use illegal drugs.  General instructions  Take over-the-counter and prescription medicines only   as told by your health care provider.  Keep all follow-up visits as told by your health care provider. This is important. Get help right away if:  You feel palpitations that are frequent or continual.  You have chest pain.  You have shortness of breath.  You have sweating for no  reason.  You have nausea and vomiting.  You become light-headed or you faint. This information is not intended to replace advice given to you by your health care provider. Make sure you discuss any questions you have with your health care provider. Document Released: 11/05/2003 Document Revised: 11/12/2015 Document Reviewed: 08/25/2015 Elsevier Interactive Patient Education  2018 Elsevier Inc.  

## 2017-09-11 LAB — CBC WITH DIFFERENTIAL/PLATELET
BASOS ABS: 49 {cells}/uL (ref 0–200)
Basophils Relative: 0.9 %
EOS ABS: 92 {cells}/uL (ref 15–500)
Eosinophils Relative: 1.7 %
HEMATOCRIT: 40.2 % (ref 35.0–45.0)
HEMOGLOBIN: 13.7 g/dL (ref 11.7–15.5)
Lymphs Abs: 1469 cells/uL (ref 850–3900)
MCH: 30.6 pg (ref 27.0–33.0)
MCHC: 34.1 g/dL (ref 32.0–36.0)
MCV: 89.9 fL (ref 80.0–100.0)
MPV: 13.1 fL — ABNORMAL HIGH (ref 7.5–12.5)
Monocytes Relative: 5 %
NEUTROS PCT: 65.2 %
Neutro Abs: 3521 cells/uL (ref 1500–7800)
Platelets: 162 10*3/uL (ref 140–400)
RBC: 4.47 10*6/uL (ref 3.80–5.10)
RDW: 11.6 % (ref 11.0–15.0)
Total Lymphocyte: 27.2 %
WBC: 5.4 10*3/uL (ref 3.8–10.8)
WBCMIX: 270 {cells}/uL (ref 200–950)

## 2017-09-11 LAB — FERRITIN: Ferritin: 40 ng/mL (ref 10–154)

## 2017-09-11 LAB — TSH: TSH: 0.9 mIU/L

## 2017-09-11 NOTE — Progress Notes (Signed)
Call pt: thyroid levels are in normal range but trending toward "hyper"thyroid state. If you have no complaints will keep at same dose but recheck in 6 months. Ok to refill for 6 months.   No anemia/iron stores good.

## 2017-09-12 ENCOUNTER — Encounter: Payer: Self-pay | Admitting: Physician Assistant

## 2017-09-12 NOTE — Progress Notes (Signed)
Subjective:    Patient ID: Danielle [name], female    DOB: [birth year], 27 y.o.   MRN: [id number]  HPI Pt is a 27 yo female who presents to the clinic for medication refills.   Pt is struggling a lot with her anxiety. Her husband was in a critical motorcycle accident a few weeks ago and now needs 24 hour care. She continues to use xanax very sparingly. Denies any SI/HC.   Hypothyroidism- needs labs. Takes medication daily.   She is having some palpitations. She has had stress test in the past that was normal.    .. Active Ambulatory Problems    Diagnosis Date Noted  . Frequent headaches 10/30/2014  . Facial numbness 10/30/2014  . Leg numbness 10/30/2014  . Generalized anxiety disorder 10/30/2014  . Hypothyroidism 11/11/2014  . Vitamin D deficiency 11/11/2014  . Panic attack 05/15/2016  . 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 No energy 07/20/2016  . Thyromegaly 07/20/2016  . Depression 01/04/2012  . Dyspareunia 04/24/2012  . Inflammatory bowel disease 09/25/2016  . Thrombocytopenia ([facility]) 08/17/2011  . 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 Palpitations 12/28/2016   Resolved Ambulatory Problems    Diagnosis Date Noted  . No Resolved Ambulatory Problems   Past Medical History:  Diagnosis Date  . Hypothyroidism       Review of Systems  All other systems reviewed and are negative.      Objective:   Physical Exam  Constitutional: She is oriented to person, place, and time. She appears well-developed and well-nourished.  HENT:  Head: Normocephalic and atraumatic.  Neck: Normal range of motion. Neck supple. No thyromegaly present.  Cardiovascular: Normal rate and regular rhythm.  Pulmonary/Chest: Effort normal and breath sounds normal.  Neurological: She is alert and oriented to person, place, and time.  Psychiatric: She has a normal mood and affect. Her behavior is normal.          Assessment & Plan:  Marland Kitchen.Marland Kitchen.Danielle [name] was seen today for  follow-up.  Diagnoses and all orders for this visit:  Acquired hypothyroidism -     TSH  Generalized anxiety disorder -     Discontinue: clonazePAM (KLONOPIN) 0.5 MG tablet; Take 1/2 to 1 tablet every day as needed for anxiety. -     clonazePAM (KLONOPIN) 0.5 MG tablet; Take 1/2 to 1 tablet every day as needed for anxiety.  Palpitations -     CBC with Differential/Platelet -     Ferritin  .Marland Kitchen. [facility] screen [facility] 09/10/2017 12/27/2016  Decreased Interest 0 0  Down, Depressed, Hopeless 1 1  PHQ - 2 Score 1 1  Altered sleeping 1 -  Tired, decreased energy 1 -  Change in appetite 0 -  Feeling bad or failure about yourself  0 -  Trouble concentrating 0 -  Moving slowly or fidgety/restless 0 -  Suicidal thoughts 0 -  PHQ-9 Score 3 -  Difficult doing work/chores Somewhat difficult -   .. GAD 7 : Generalized Anxiety Score 09/10/2017 12/27/2016  Nervous, Anxious, on Edge 2 2  Control/stop worrying 1 1  Worry too much - different things 2 1  Trouble relaxing 3 2  Restless 0 1  Easily annoyed or irritable 2 2  Afraid - awful might happen 1 0  Total GAD 7 Score 11 9  Anxiety Difficulty Somewhat difficult -   Will get labs and adjust medications as needed.   Pt is controlling anxiety on her own and using klonapin as needed.  Discussed risk of dependence. Pt aware. Consider counseling.  Follow up as needed.   Reassurance given about palpitations. HO given. Likely due to stress. Could consider holter monitor if worsening.   Marland Kitchen.Spent 30 minutes with patient and greater than 50 percent of visit spent counseling patient regarding treatment plan.

## 2017-10-14 IMAGING — US US SOFT TISSUE HEAD/NECK
1 series · 14 of 25 positions shown · non-contrast
Comparison: None.

CLINICAL DATA: Hypothyroid.

EXAM:
THYROID ULTRASOUND
TECHNIQUE: Ultrasound examination of the thyroid gland and adjacent soft
tissues was performed.

[Series 1: us soft tissue head/neck · 0.04mm/px · 14 of 36 slices shown]
[im 1/36]
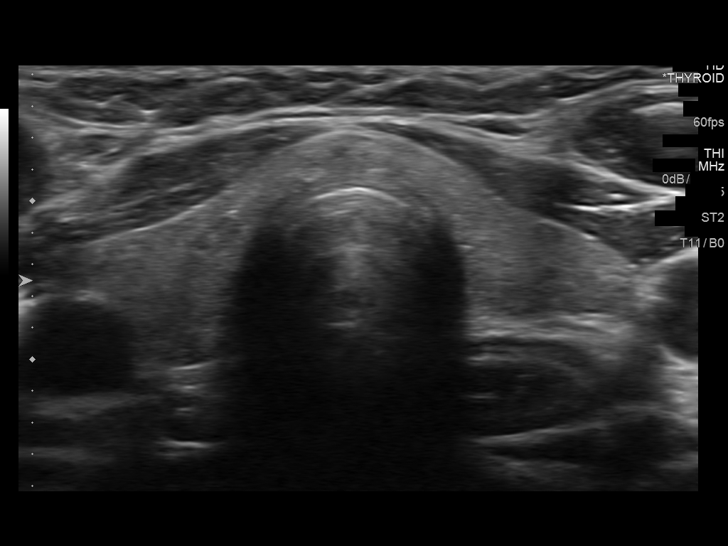
[im 3/36]
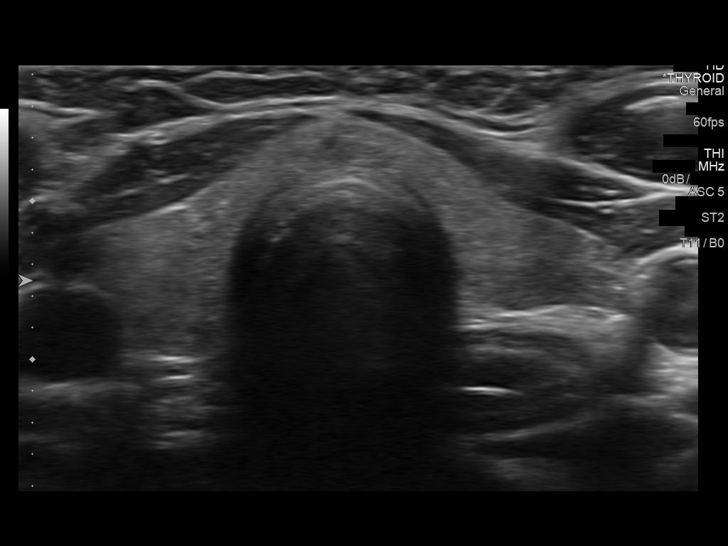
[im 6/36]
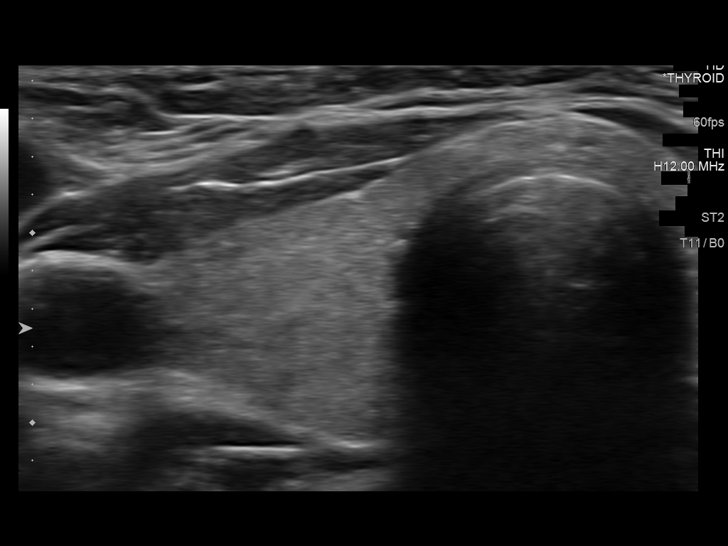
[im 9/36]
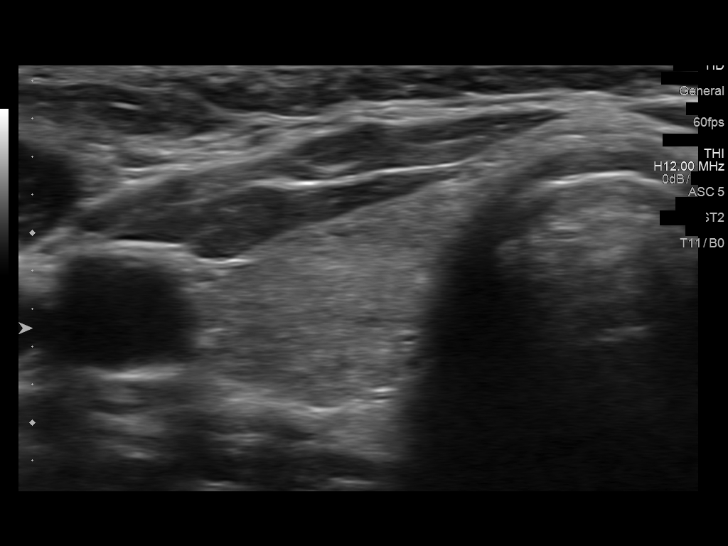
[im 12/36]
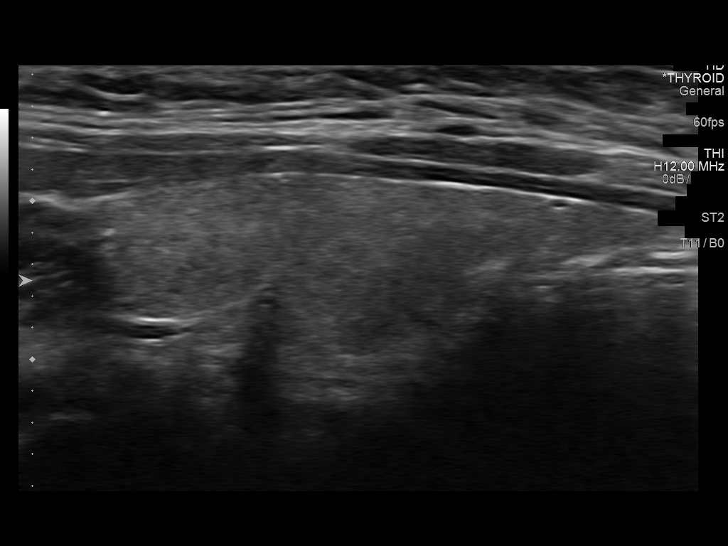
[im 14/36]
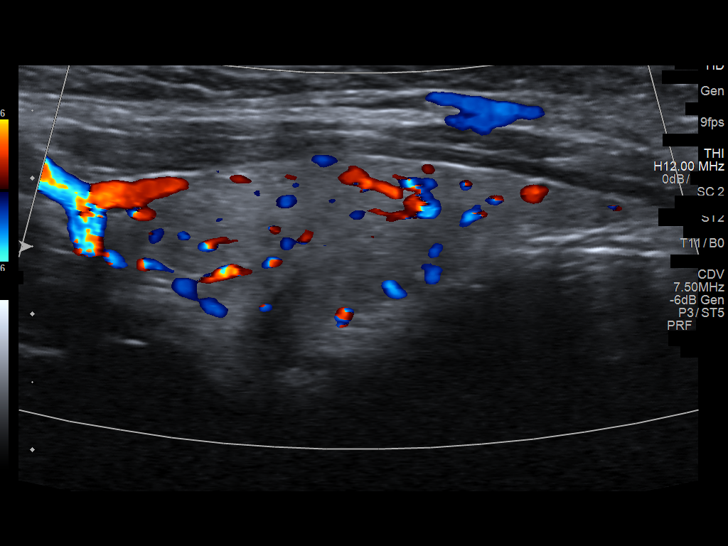
[im 17/36]
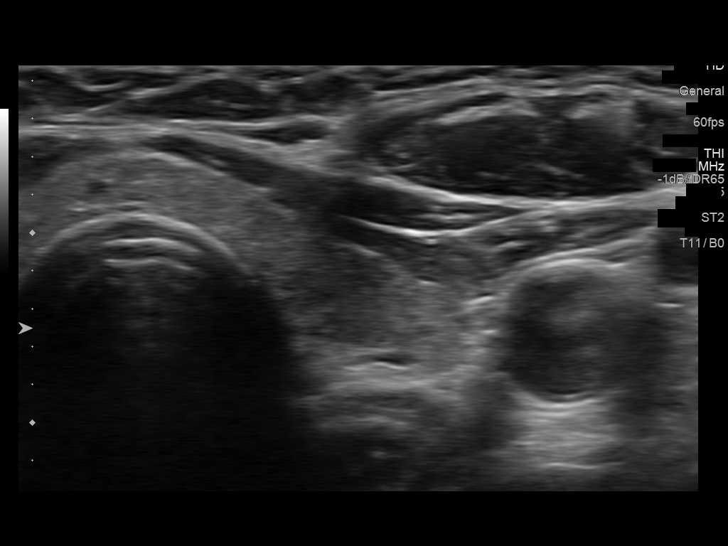
[im 19/36]
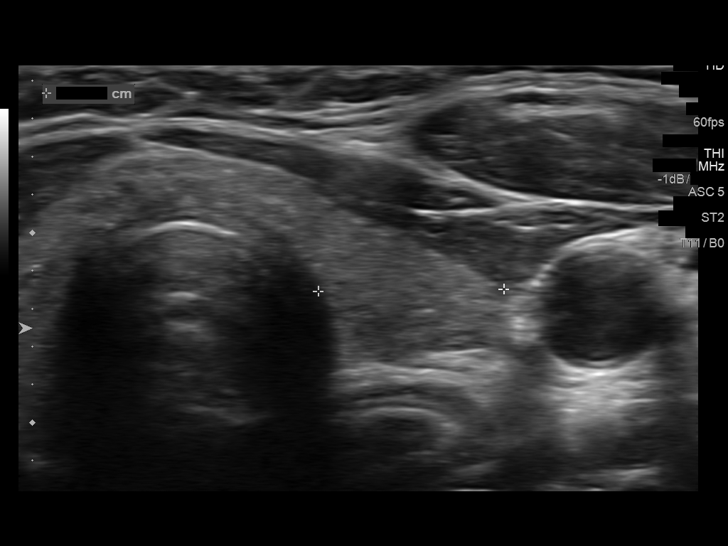
[im 22/36]
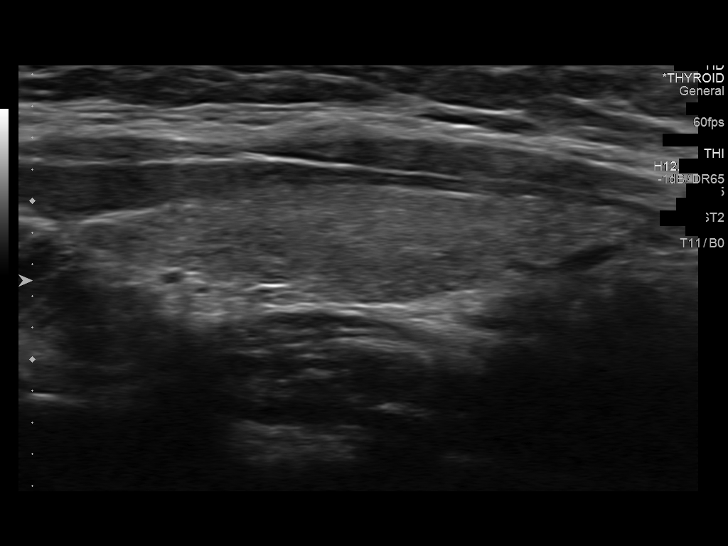
[im 24/36]
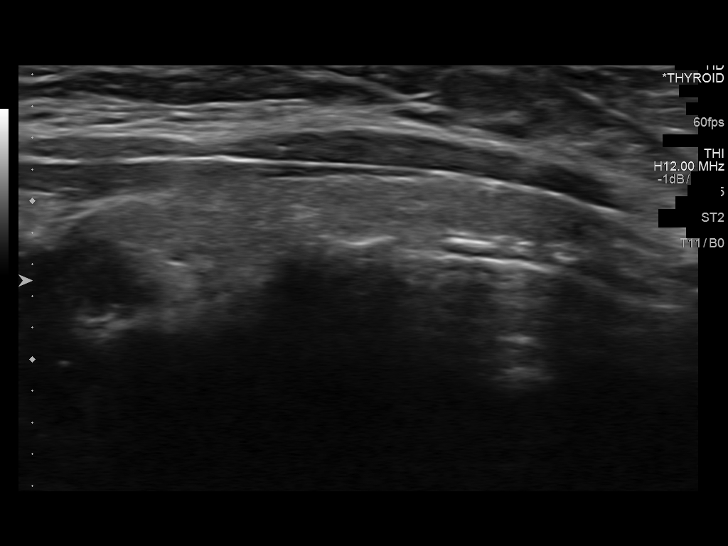
[im 27/36]
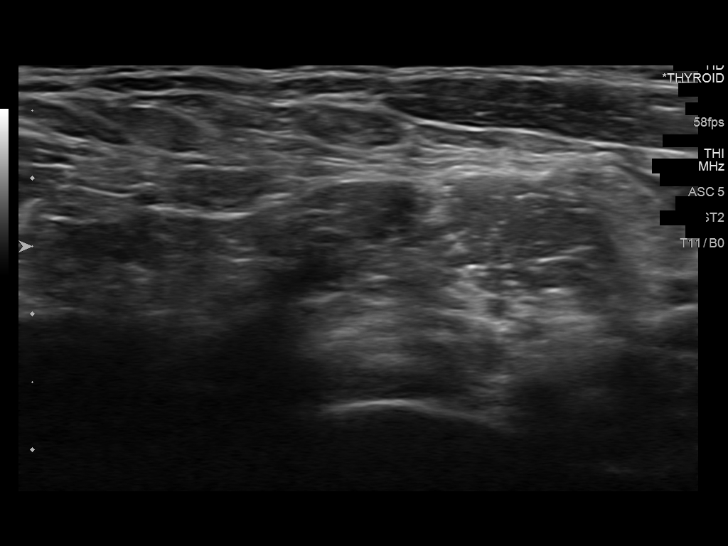
[im 30/36]
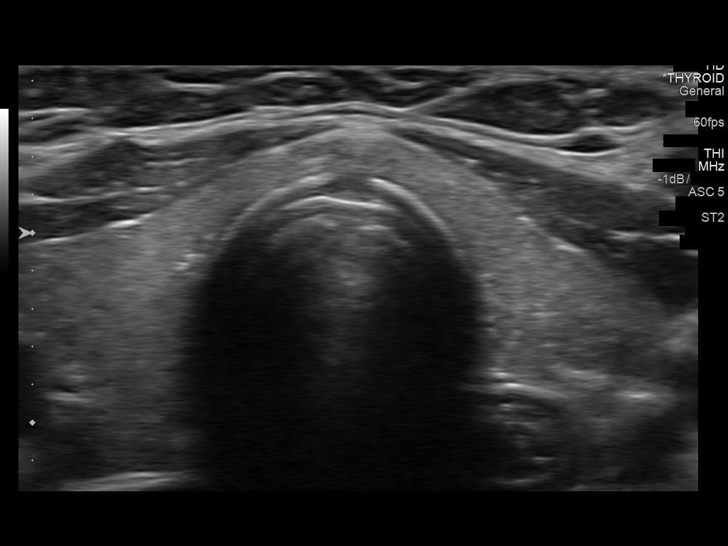
[im 33/36]
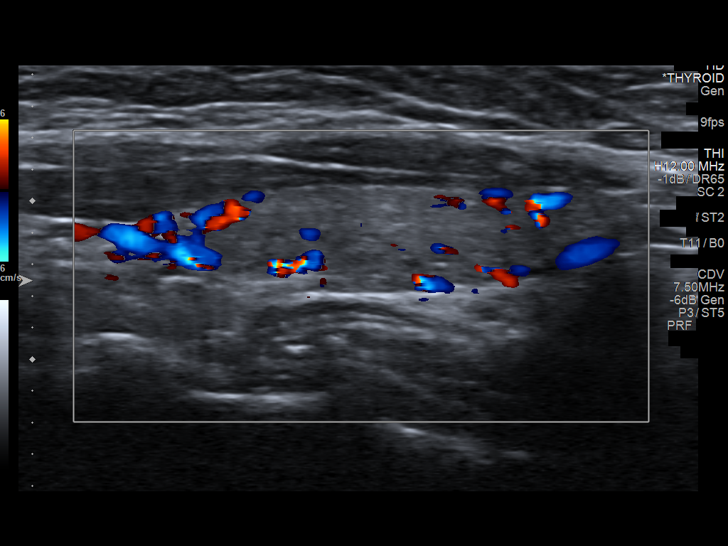
[im 36/36]
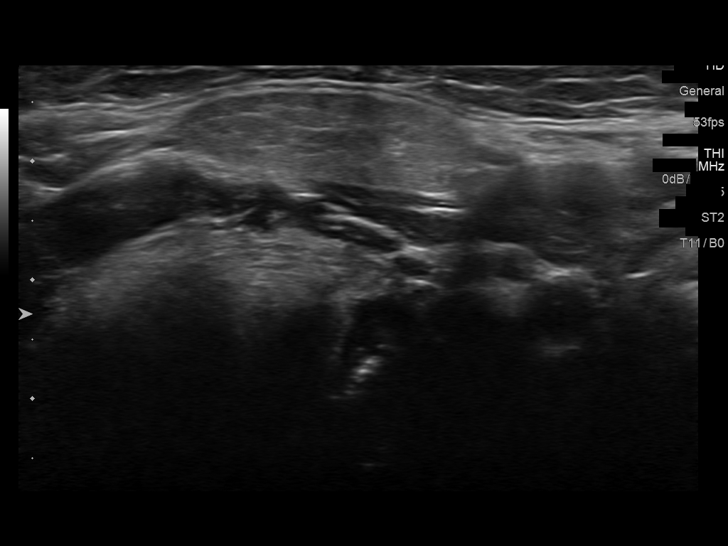

[14 of 25 positions shown; findings below may reference images not displayed]

FINDINGS: Parenchymal Echotexture: Normal

Isthmus: 0.3 cm

Right lobe: 4.4 x 1.4 x 1.4 cm

Left lobe: 3.6 x 0.8 x 1.0 cm

_________________________________________________________

Estimated total number of nodules >/= 1 cm: 0

Number of spongiform nodules >/=  2 cm not described below (TR1): 0

Number of mixed cystic and solid nodules >/= 1.5 cm not described
below (TR2): 0

_________________________________________________________

No discrete nodules are seen within the thyroid gland.
IMPRESSION: Within normal limits.  No evidence of nodule.

The above is in keeping with the ACR TI-RADS recommendations - [HOSPITAL] 8285;[DATE].

## 2017-10-31 ENCOUNTER — Ambulatory Visit (INDEPENDENT_AMBULATORY_CARE_PROVIDER_SITE_OTHER): Payer: Self-pay | Admitting: Physician Assistant

## 2017-10-31 ENCOUNTER — Encounter: Payer: Self-pay | Admitting: Physician Assistant

## 2017-10-31 VITALS — BP 111/75 | HR 74 | Temp 98.0°F | Ht 60.0 in | Wt 105.0 lb

## 2017-10-31 DIAGNOSIS — J029 Acute pharyngitis, unspecified: Secondary | ICD-10-CM

## 2017-10-31 MED ORDER — FLUTICASONE PROPIONATE 50 MCG/ACT NA SUSP: 2.0000 | Freq: Every day | NASAL | 2 refills | Status: DC

## 2017-10-31 MED ORDER — METHYLPREDNISOLONE 4 MG PO TBPK: ORAL_TABLET | ORAL | 0 refills | Status: DC

## 2017-10-31 NOTE — Patient Instructions (Addendum)
Start zyrtec every day.    Sore Throat When you have a sore throat, your throat may:  Hurt.  Burn.  Feel irritated.  Feel scratchy.  Many things can cause a sore throat, including:  An infection.  Allergies.  Dryness in the air.  Smoke or pollution.  Gastroesophageal reflux disease (GERD).  A tumor.  A sore throat can be the first sign of another sickness. It can happen with other problems, like coughing or a fever. Most sore throats go away without treatment. Follow these instructions at home:  Take over-the-counter medicines only as told by your doctor.  Drink enough fluids to keep your pee (urine) clear or pale yellow.  Rest when you feel you need to.  To help with pain, try: ? Sipping warm liquids, such as broth, herbal tea, or warm water. ? Eating or drinking cold or frozen liquids, such as frozen ice pops. ? Gargling with a salt-water mixture 3-4 times a day or as needed. To make a salt-water mixture, add -1 tsp of salt in 1 cup of warm water. Mix it until you cannot see the salt anymore. ? Sucking on hard candy or throat lozenges. ? Putting a cool-mist humidifier in your bedroom at night. ? Sitting in the bathroom with the door closed for 5-10 minutes while you run hot water in the shower.  Do not use any tobacco products, such as cigarettes, chewing tobacco, and e-cigarettes. If you need help quitting, ask your doctor. Contact a doctor if:  You have a fever for more than 2-3 days.  You keep having symptoms for more than 2-3 days.  Your throat does not get better in 7 days.  You have a fever and your symptoms suddenly get worse. Get help right away if:  You have trouble breathing.  You cannot swallow fluids, soft foods, or your saliva.  You have swelling in your throat or neck that gets worse.  You keep feeling like you are going to throw up (vomit).  You keep throwing up. This information is not intended to replace advice given to you by your  health care provider. Make sure you discuss any questions you have with your health care provider. Document Released: 12/28/2007 Document Revised: 11/14/2015 Document Reviewed: 01/08/2015 Elsevier Interactive Patient Education  [employer]2018 Elsevier Inc.

## 2017-10-31 NOTE — Progress Notes (Signed)
   Subjective:    Patient ID: Danielle [name], female    DOB: 1990/11/26, 27 y.o.   MRN: [id number]  HPI  Pt is a 27 yo female with MDD, GAD, hypothyroidism who presents to the clinic with ST for 1 month. Symptoms are off and on. Started with cold like symptoms. Seemed to get a little better then worsened over the weekend. She had some phelgm and notice a bit of blood in sputum. Yesterday got a lot better. No ST today. At times she has some ear pain. No fever, chills, body aches. She does not have known seasonal or environmental allergies. At times she feels a little congested. Denies any PND. She is tired but feel like that all the time. She has had mono before. She is concerned about thyroid or throat cancer. Denies any problems swallowing.   .. Active Ambulatory Problems    Diagnosis Date Noted  . Frequent headaches 10/30/2014  . Facial numbness 10/30/2014  . Leg numbness 10/30/2014  . Generalized anxiety disorder 10/30/2014  . Hypothyroidism 11/11/2014  . Vitamin D deficiency 11/11/2014  . Panic attack 05/15/2016  . 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 No energy 07/20/2016  . Thyromegaly 07/20/2016  . Depression 01/04/2012  . Dyspareunia 04/24/2012  . Inflammatory bowel disease 09/25/2016  . Thrombocytopenia (HCC) 08/17/2011  . 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 Palpitations 12/28/2016   Resolved Ambulatory Problems    Diagnosis Date Noted  . No Resolved Ambulatory Problems   Past Medical History:  Diagnosis Date  . Hypothyroidism       Review of Systems See HPI.     Objective:   Physical Exam  Constitutional: She is oriented to person, place, and time. She appears well-developed and well-nourished.  HENT:  Head: Normocephalic and atraumatic.  Right Ear: External ear normal.  Left Ear: External ear normal.  Mouth/Throat: Oropharynx is clear and moist. No oropharyngeal exudate.  TM's clear bilaterally.  Bilateral nasal turbinates red and  swollen.  Scant PND and erythema. Uvula midline and tonsils not enlarged.  No sinus tenderness or pressure.   Eyes: Conjunctivae and EOM are normal.  Neck: Normal range of motion. Neck supple.  Very small anterior cervical and submandibular lymph nodes enlarged. Non tender.   Cardiovascular: Normal rate and regular rhythm.  Pulmonary/Chest: Effort normal and breath sounds normal.  Lymphadenopathy:    She has cervical adenopathy.  Neurological: She is alert and oriented to person, place, and time.  Psychiatric: She has a normal mood and affect. Her behavior is normal.          Assessment & Plan:  Marland Kitchen.Marland Kitchen.Diagnoses and all orders for this visit:  Sore throat -     fluticasone (FLONASE) 50 MCG/ACT nasal spray; Place 2 sprays into both nostrils daily. -     methylPREDNISolone (MEDROL DOSEPAK) 4 MG TBPK tablet; Take as directed by package insert.  discussed viral vs bacterial vs allergic. Reassured does not seem bacterial. Lasting a bit long for viral. She has had mono before. Seems like could be allergy related. Prednisone pack given. Start flonase and zyrtec. Discussed symptomatic care. Consider humidifier in room. Reassurance given I do not suspect any cancer. Keep close follow up if symptoms do not resolve.   Marland Kitchen..Spent 30 minutes with patient and greater than 50 percent of visit spent counseling patient regarding treatment plan.

## 2017-11-01 ENCOUNTER — Encounter: Payer: Self-pay | Admitting: Physician Assistant

## 2018-01-10 ENCOUNTER — Ambulatory Visit (INDEPENDENT_AMBULATORY_CARE_PROVIDER_SITE_OTHER): Payer: Self-pay | Admitting: Family Medicine

## 2018-01-10 ENCOUNTER — Encounter: Payer: Self-pay | Admitting: Family Medicine

## 2018-01-10 VITALS — BP 109/63 | HR 81 | Temp 98.2°F | Ht 59.0 in | Wt 108.0 lb

## 2018-01-10 DIAGNOSIS — R058 Other specified cough: Secondary | ICD-10-CM

## 2018-01-10 DIAGNOSIS — R05 Cough: Secondary | ICD-10-CM

## 2018-01-10 MED ORDER — BENZONATATE 200 MG PO CAPS: 200.0000 mg | ORAL_CAPSULE | Freq: Three times a day (TID) | ORAL | 2 refills | Status: DC | PRN

## 2018-01-10 NOTE — Patient Instructions (Addendum)
Thank you for coming in today. Contine Zarbess cough medicine.  Add Robitussin twice daily over the counter.  Add cough drops that contain menthol.   Use the prescription Tessalon pearles as needed up to 3x daily.   Next step if not better is prednisone.  Send me a message if needed.    If tessalon does not work at all we can use codeine based cough medicine. Again let me know.   Send a message to [facility] in December to recheck thyroid labs.   Cough, Adult A cough helps to clear your throat and lungs. A cough may last only 2-3 weeks (acute), or it may last longer than 8 weeks (chronic). Many different things can cause a cough. A cough may be a sign of an illness or another medical condition. Follow these instructions at home:  Pay attention to any changes in your cough.  Take medicines only as told by your doctor. ? If you were prescribed an antibiotic medicine, take it as told by your doctor. Do not stop taking it even if you start to feel better. ? Talk with your doctor before you try using a cough medicine.  Drink enough fluid to keep your pee (urine) clear or pale yellow.  If the air is dry, use a cold steam vaporizer or humidifier in your home.  Stay away from things that make you cough at work or at home.  If your cough is worse at night, try using extra pillows to raise your head up higher while you sleep.  Do not smoke, and try not to be around smoke. If you need help quitting, ask your doctor.  Do not have caffeine.  Do not drink alcohol.  Rest as needed. Contact a doctor if:  You have new problems (symptoms).  You cough up yellow fluid (pus).  Your cough does not get better after 2-3 weeks, or your cough gets worse.  Medicine does not help your cough and you are not sleeping well.  You have pain that gets worse or pain that is not helped with medicine.  You have a fever.  You are losing weight and you do not know why.  You have night sweats. Get help  right away if:  You cough up blood.  You have trouble breathing.  Your heartbeat is very fast. This information is not intended to replace advice given to you by your health care provider. Make sure you discuss any questions you have with your health care provider. Document Released: 12/01/2010 Document Revised: 08/26/2015 Document Reviewed: 05/27/2014 Elsevier Interactive Patient Education  [employer].

## 2018-01-10 NOTE — Progress Notes (Signed)
Danielle [name] is a 27 y.o. female who presents to [facility] today for cough.  Shavell notes a 2-week history of cough.  At symptom onset she also had fevers and chills and congestion which have all since resolved.  She notes the cough is typically nonproductive occurs throughout the day and does interfere with sleep.  She is tried some over-the-counter honey-based natural cough syrup which has not helped much.  She denies wheezing shortness of breath chest pain or palpitations.  She notes cough is quite obnoxious.  She is up-to-date with Tdap vaccine last received February 2018.   ROS as above:  Exam:  BP 109/63   Pulse 81   Temp 98.2 F (36.8 C) (Oral)   Ht 4\' 11"  (1.499 m)   Wt 108 lb (49 kg)   SpO2 97%   BMI 21.81 kg/m  Wt Readings from Last 5 Encounters:  01/10/18 108 lb (49 kg)  10/31/17 105 lb (47.6 kg)  09/10/17 103 lb (46.7 kg)  12/27/16 114 lb (51.7 kg)  09/25/16 114 lb (51.7 kg)    Gen: Well NAD HEENT: EOMI,  MMM normal nasal turbinates and posterior pharynx.  Tympanic membranes are normal-appearing no significant cervical lymphadenopathy. Lungs: Normal work of breathing. CTABL.  Frequent cough Heart: RRR no MRG Abd: NABS, Soft. Nondistended, Nontender Exts: Brisk capillary refill, warm and well perfused.   Lab and Radiology Results No results found for this or any previous visit (from the past 72 hour(s)). No results found.    Assessment and Plan: 27 y.o. female with persistent cough likely post viral.  Plan for treatment with over-the-counter medication including continuing natural honey-based cough syrup, starting menthol based cough drops, and Robitussin.  Will add Tessalon Perles.  If not better next step would be either codeine cough syrup or prednisone.  Recheck as needed.   No orders of the defined types were placed in this  encounter.  Meds ordered this encounter  Medications  . benzonatate (TESSALON) 200 MG capsule    Sig: Take 1 capsule (200 mg total) by mouth 3 (three) times daily as needed for cough.    Dispense:  45 capsule    Refill:  2     Historical information moved to improve visibility of documentation.  Past Medical History:  Diagnosis Date  . Hypothyroidism    Past Surgical History:  Procedure Laterality Date  . UPPER GI ENDOSCOPY    . WISDOM TOOTH EXTRACTION     Social History   Tobacco Use  . Smoking status: Never Smoker  . Smokeless tobacco: Never Used  Substance Use Topics  . Alcohol use: Yes    Alcohol/week: 0.0 standard drinks    Comment: Socially   family history includes Breast cancer in her paternal grandmother; Diabetes in her paternal grandfather; Heart attack in her paternal grandfather; Hypertension in her mother.  Medications: Current Outpatient Medications  Medication Sig Dispense Refill  . clonazePAM (KLONOPIN) 0.5 MG tablet Take 1/2 to 1 tablet every day as needed for anxiety. 30 tablet 5  . fluticasone (FLONASE) 50 MCG/ACT nasal spray Place 2 sprays into both nostrils daily. 16 g 2  . levothyroxine (SYNTHROID, LEVOTHROID) 50 MCG tablet Take 1 tablet (50 mcg total) by mouth daily. 30 tablet 5  . methylPREDNISolone (MEDROL DOSEPAK) 4 MG TBPK tablet Take as directed by package insert. 21 tablet 0  . benzonatate (TESSALON) 200 MG capsule Take 1 capsule (200 mg total)  by mouth 3 (three) times daily as needed for cough. 45 capsule 2   No current facility-administered medications for this visit.    No Known Allergies   Discussed warning signs or symptoms. Please see discharge instructions. Patient expresses understanding.

## 2018-01-14 ENCOUNTER — Telehealth: Payer: Self-pay

## 2018-01-14 MED ORDER — GUAIFENESIN-CODEINE 100-10 MG/5ML PO SOLN: 5.0000 mL | Freq: Four times a day (QID) | ORAL | 0 refills | Status: DC | PRN

## 2018-01-14 NOTE — Telephone Encounter (Signed)
Pt called over the weekend stating she was taking OTC medications and also the coughing pearls but her cough is still there.   Pt states she saw Dr Denyse Amass and was advised to call office and he would send in some codeine syrup for her if cough not improved.  Please advise

## 2018-01-14 NOTE — Telephone Encounter (Signed)
Codeine cough medicine sent electronically to pharmacy

## 2018-01-14 NOTE — Telephone Encounter (Signed)
Pt advised.

## 2018-01-31 ENCOUNTER — Other Ambulatory Visit: Payer: Self-pay | Admitting: Physician Assistant

## 2018-01-31 DIAGNOSIS — E039 Hypothyroidism, unspecified: Secondary | ICD-10-CM

## 2018-03-12 ENCOUNTER — Telehealth: Payer: Self-pay

## 2018-03-12 ENCOUNTER — Other Ambulatory Visit: Payer: Self-pay

## 2018-03-12 ENCOUNTER — Other Ambulatory Visit: Payer: Self-pay | Admitting: Physician Assistant

## 2018-03-12 DIAGNOSIS — E039 Hypothyroidism, unspecified: Secondary | ICD-10-CM

## 2018-03-12 MED ORDER — LEVOTHYROXINE SODIUM 50 MCG PO TABS: 50.0000 ug | ORAL_TABLET | Freq: Every day | ORAL | 0 refills | Status: DC

## 2018-03-12 NOTE — Telephone Encounter (Signed)
Danielle [name] called today and had a question about her clonazepam. She said her next refill is not due until around 03/24/2018, but the pharmacy has it written to be filled earlier than the 22nd of December. She wanted to know if we should rewrite the prescription for the correct date? Or if that would even be possible? Thanks!

## 2018-03-13 ENCOUNTER — Other Ambulatory Visit: Payer: Self-pay | Admitting: Physician Assistant

## 2018-03-13 DIAGNOSIS — F411 Generalized anxiety disorder: Secondary | ICD-10-CM

## 2018-03-13 NOTE — Telephone Encounter (Signed)
Called and left a voicemail notifying patient of that prescription being her last one. I provided the office's contact information for her to call back if any further questions or concerns.

## 2018-03-13 NOTE — Telephone Encounter (Signed)
Her next refill should be available to the 10th by my records and should be her last refill. Call pharmacy to confirm.

## 2018-03-14 ENCOUNTER — Other Ambulatory Visit: Payer: Self-pay

## 2018-03-14 DIAGNOSIS — F411 Generalized anxiety disorder: Secondary | ICD-10-CM

## 2018-03-14 MED ORDER — CLONAZEPAM 0.5 MG PO TABS: ORAL_TABLET | ORAL | 0 refills | Status: DC

## 2018-03-14 NOTE — Telephone Encounter (Signed)
Looks like Lesly Rubensteinjade already took care of it!

## 2018-03-14 NOTE — Telephone Encounter (Signed)
[name] is one of Jade's patients. I received a refill request for her Clonazepam. She only has one refill left as she is coming into see Lesly RubensteinJade next month for a medication follow-up. I just needed someone to approve the medication refill since Lesly RubensteinJade is not here, and the prescription keeps wanting to print. I have pended the medication for refill. Thanks so much!

## 2018-04-15 ENCOUNTER — Encounter: Payer: Self-pay | Admitting: Physician Assistant

## 2018-04-15 ENCOUNTER — Ambulatory Visit (INDEPENDENT_AMBULATORY_CARE_PROVIDER_SITE_OTHER): Payer: Medicaid Other | Admitting: Physician Assistant

## 2018-04-15 VITALS — BP 113/77 | HR 67 | Ht 59.0 in | Wt 117.0 lb

## 2018-04-15 DIAGNOSIS — Z13220 Encounter for screening for lipoid disorders: Secondary | ICD-10-CM

## 2018-04-15 DIAGNOSIS — Z131 Encounter for screening for diabetes mellitus: Secondary | ICD-10-CM

## 2018-04-15 DIAGNOSIS — F411 Generalized anxiety disorder: Secondary | ICD-10-CM

## 2018-04-15 DIAGNOSIS — E039 Hypothyroidism, unspecified: Secondary | ICD-10-CM

## 2018-04-15 MED ORDER — LEVOTHYROXINE SODIUM 50 MCG PO TABS: 50.0000 ug | ORAL_TABLET | Freq: Every day | ORAL | 3 refills | Status: DC

## 2018-04-15 MED ORDER — CLONAZEPAM 0.5 MG PO TABS: ORAL_TABLET | ORAL | 5 refills | Status: DC

## 2018-04-15 NOTE — Progress Notes (Signed)
fol

## 2018-04-15 NOTE — Progress Notes (Signed)
Subjective:    Patient ID: Danielle [name], female    DOB: Mar 31, 1991, 28 y.o.   MRN: [id number]  HPI  Patient is a 28 year old female who presents to the clinic for anxiety and hypothyroidism follow-up.  Hypothyroidism-patient is doing well.  She is taking her medications as directed.  She has no concerns or complaints.  Anxiety-seems to be overall controlled.  She has her good and bad days.  On average she is using her clonazepam once or twice a week.  She is trying to cooperate other relaxation and stress relieving habits in her life.  Most of her anxiety tends to surface around her health.  If she gets any aches or pains she immediately goes into a panic attack.  She tries to save her clonazepam for panic attacks only.  .. Active Ambulatory Problems    Diagnosis Date Noted  . Frequent headaches 10/30/2014  . Facial numbness 10/30/2014  . Leg numbness 10/30/2014  . Generalized anxiety disorder 10/30/2014  . Hypothyroidism 11/11/2014  . Vitamin D deficiency 11/11/2014  . Panic attack 05/15/2016  . 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 No energy 07/20/2016  . Thyromegaly 07/20/2016  . Depression 01/04/2012  . Dyspareunia 04/24/2012  . Inflammatory bowel disease 09/25/2016  . Thrombocytopenia (HCC) 08/17/2011  . 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 Palpitations 12/28/2016   Resolved Ambulatory Problems    Diagnosis Date Noted  . No Resolved Ambulatory Problems   No Additional Past Medical History        Review of Systems  All other systems reviewed and are negative.      Objective:   Physical Exam Vitals signs reviewed.  Constitutional:      Appearance: Normal appearance.  HENT:     Head: Normocephalic and atraumatic.     Right Ear: Tympanic membrane normal.     Left Ear: Tympanic membrane normal.  Cardiovascular:     Rate and Rhythm: Normal rate and regular rhythm.  Pulmonary:     Effort: Pulmonary effort is normal.   Neurological:     General: No focal deficit present.     Mental Status: She is alert and oriented to person, place, and time.  Psychiatric:        Mood and Affect: Mood normal.        Behavior: Behavior normal.           Assessment & Plan:  Marland Kitchen.Marland Kitchen.Danielle [name] was seen today for follow-up.  Diagnoses and all orders for this visit:  Hypothyroidism, unspecified type -     TSH -     levothyroxine (SYNTHROID, LEVOTHROID) 50 MCG tablet; Take 1 tablet (50 mcg total) by mouth daily.  Generalized anxiety disorder -     clonazePAM (KLONOPIN) 0.5 MG tablet; TAKE 1/2 TO 1 TABLET BY MOUTH EVERY DAY AS NEEDED FOR ANXIETY  Screening for lipid disorders -     Lipid Panel w/reflex Direct LDL  Screening for diabetes mellitus -     COMPLETE METABOLIC PANEL WITH GFR   .Marland Kitchen. [facility] 04/15/2018 09/10/2017 12/27/2016  Decreased Interest 0 0 0  Down, Depressed, Hopeless 1 1 1   PHQ - 2 Score 1 1 1   Altered sleeping 2 1 -  Tired, decreased energy 2 1 -  Change in appetite 0 0 -  Feeling bad or failure about yourself  0 0 -  Trouble concentrating 0 0 -  Moving slowly or fidgety/restless 0 0 -  Suicidal thoughts 0 0 -  PHQ-9 Score 5 3 -  Difficult doing work/chores Somewhat difficult Somewhat difficult -   .. GAD 7 : Generalized Anxiety Score 04/15/2018 09/10/2017 12/27/2016  Nervous, Anxious, on Edge 2 2 2   Control/stop worrying 2 1 1   Worry too much - different things 2 2 1   Trouble relaxing 1 3 2   Restless 0 0 1  Easily annoyed or irritable 1 2 2   Afraid - awful might happen 0 1 0  Total GAD 7 Score 8 11 9   Anxiety Difficulty Somewhat difficult Somewhat difficult -    Medication refill given.  Follow up in 6 months.  Continue to use klonapin as needed only. Discussed daily medication and patient states past use of those medication she has not liked. She declines another trial.  Continue to exercise and practice meditation.   Fasting labs ordered.

## 2018-04-16 LAB — LIPID PANEL W/REFLEX DIRECT LDL
Cholesterol: 182 mg/dL (ref <200)
HDL: 69 mg/dL (ref >50)
LDL Cholesterol (Calc): 95 mg/dL (calc)
NON-HDL CHOLESTEROL (CALC): 113 mg/dL (ref <130)
TRIGLYCERIDES: 89 mg/dL (ref <150)
Total CHOL/HDL Ratio: 2.6 (calc) (ref <5.0)

## 2018-04-16 LAB — COMPLETE METABOLIC PANEL WITH GFR
AG Ratio: 2 (calc) (ref 1.0–2.5)
ALT: 17 U/L (ref 6–29)
AST: 18 U/L (ref 10–30)
Albumin: 4.5 g/dL (ref 3.6–5.1)
Alkaline phosphatase (APISO): 40 U/L (ref 33–115)
BUN: 7 mg/dL (ref 7–25)
CO2: 25 mmol/L (ref 20–32)
Calcium: 9.6 mg/dL (ref 8.6–10.2)
Chloride: 98 mmol/L (ref 98–110)
Creat: 0.51 mg/dL (ref 0.50–1.10)
GFR, EST AFRICAN AMERICAN: 153 mL/min/{1.73_m2} (ref >=60)
GFR, Est Non African American: 132 mL/min/{1.73_m2} (ref >=60)
GLUCOSE: 84 mg/dL (ref 65–99)
Globulin: 2.3 g/dL (calc) (ref 1.9–3.7)
Potassium: 3.9 mmol/L (ref 3.5–5.3)
Sodium: 134 mmol/L — ABNORMAL LOW (ref 135–146)
Total Bilirubin: 1 mg/dL (ref 0.2–1.2)
Total Protein: 6.8 g/dL (ref 6.1–8.1)

## 2018-04-16 LAB — TSH: TSH: 0.91 mIU/L

## 2018-04-16 NOTE — Progress Notes (Signed)
Call pt: thyroid remains stable. Medication refill sent. Follow up in 1 year.  Cholesterol looks fantastic. Sodium was just a tad low in blood. You could increase salt intake just a little.

## 2018-10-14 ENCOUNTER — Encounter: Payer: Self-pay | Admitting: Physician Assistant

## 2018-10-14 ENCOUNTER — Other Ambulatory Visit: Payer: Self-pay

## 2018-10-14 ENCOUNTER — Ambulatory Visit (INDEPENDENT_AMBULATORY_CARE_PROVIDER_SITE_OTHER): Payer: Medicaid Other | Admitting: Physician Assistant

## 2018-10-14 VITALS — BP 109/74 | HR 71 | Temp 97.7°F | Ht 60.0 in | Wt 121.0 lb

## 2018-10-14 DIAGNOSIS — E039 Hypothyroidism, unspecified: Secondary | ICD-10-CM

## 2018-10-14 DIAGNOSIS — F411 Generalized anxiety disorder: Secondary | ICD-10-CM

## 2018-10-14 DIAGNOSIS — F410 Panic disorder [episodic paroxysmal anxiety] without agoraphobia: Secondary | ICD-10-CM

## 2018-10-14 MED ORDER — CLONAZEPAM 1 MG PO TABS: 1.0000 mg | ORAL_TABLET | Freq: Every day | ORAL | 3 refills | Status: DC

## 2018-10-14 NOTE — Progress Notes (Signed)
Subjective:    Patient ID: Danielle [name], female    DOB: [birth year], 28 y.o.   MRN: [id number]  HPI  Pt is a 28 yo female with anxiety, panic attacks, hypothyroidism who presents to the clinic for medication refills.   She has been struggling with anxiety through the covid pandemic. She actually quit her job due to safety concerns. She stays at home most of the day. She only leaves for the grocery store. She is having 1-2 panic attacks a week on average. She is finding more and more that .5mg  is not enough to take the panic away and has to take another .5mg . she is taking 1-4 a week. She continues to meet virtually with counselor. She denies any SI/HC.   Marland Kitchen.. Active Ambulatory Problems    Diagnosis Date Noted  . Frequent headaches 10/30/2014  . Facial numbness 10/30/2014  . Leg numbness 10/30/2014  . Generalized anxiety disorder 10/30/2014  . Hypothyroidism 11/11/2014  . Vitamin D deficiency 11/11/2014  . Panic attack 05/15/2016  . 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 No energy 07/20/2016  . Thyromegaly 07/20/2016  . Depression 01/04/2012  . Dyspareunia 04/24/2012  . Inflammatory bowel disease 09/25/2016  . Thrombocytopenia (HCC) 08/17/2011  . 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 Palpitations 12/28/2016   Resolved Ambulatory Problems    Diagnosis Date Noted  . No Resolved Ambulatory Problems   No Additional Past Medical History     Review of Systems  All other systems reviewed and are negative.      Objective:   Physical Exam Vitals signs reviewed.  Constitutional:      Appearance: Normal appearance.  HENT:     Head: Normocephalic.  Cardiovascular:     Rate and Rhythm: Normal rate and regular rhythm.     Pulses: Normal pulses.  Pulmonary:     Effort: Pulmonary effort is normal.     Breath sounds: Normal breath sounds.  Neurological:     General: No focal deficit present.     Mental Status: She is alert and oriented to person, place,  and time.  Psychiatric:        Mood and Affect: Mood normal.        Behavior: Behavior normal.           Assessment & Plan:  Marland Kitchen.Marland Kitchen.Danielle [name] was seen today for hypothyroidism and anxiety.  Diagnoses and all orders for this visit:  Panic attacks -     clonazePAM (KLONOPIN) 1 MG tablet; Take 1 tablet (1 mg total) by mouth daily. As needed for panic attacks.  Hypothyroidism, unspecified type  Generalized anxiety disorder -     clonazePAM (KLONOPIN) 1 MG tablet; Take 1 tablet (1 mg total) by mouth daily. As needed for panic attacks.    [facility] 10/14/2018 04/15/2018 09/10/2017 12/27/2016  Decreased Interest 0 0 0 0  Down, Depressed, Hopeless 1 1 1 1   PHQ - 2 Score 1 1 1 1   Altered sleeping 2 2 1  -  Tired, decreased energy 1 2 1  -  Change in appetite 0 0 0 -  Feeling bad or failure about yourself  0 0 0 -  Trouble concentrating 0 0 0 -  Moving slowly or fidgety/restless 0 0 0 -  Suicidal thoughts 0 0 0 -  PHQ-9 Score 4 5 3  -  Difficult doing work/chores Somewhat difficult Somewhat difficult Somewhat difficult -   .. GAD 7 : Generalized Anxiety Score 10/14/2018 04/15/2018 09/10/2017 12/27/2016  Nervous, Anxious, on Edge 3 2 2 2   Control/stop worrying 2 2 1 1   Worry too much - different things 2 2 2 1   Trouble relaxing 1 1 3 2   Restless 0 0 0 1  Easily annoyed or irritable 1 1 2 2   Afraid - awful might happen 0 0 1 0  Total GAD 7 Score 9 8 11 9   Anxiety Difficulty Somewhat difficult Somewhat difficult Somewhat difficult -    Pt does not need thyroid medication. Follow up in 6 months.   Increased clonazepam to 1mg  as needed for panic attacks. Discussed not to take every day. If taking every day need to reevaluate for daily anxiety medication. Pt does not want to retry medications and tried a lot in the past. Encouraged exercise. Continue with counseling.

## 2018-10-15 ENCOUNTER — Telehealth: Payer: Self-pay | Admitting: Neurology

## 2018-10-15 DIAGNOSIS — Z5189 Encounter for other specified aftercare: Secondary | ICD-10-CM

## 2018-10-15 DIAGNOSIS — E039 Hypothyroidism, unspecified: Secondary | ICD-10-CM

## 2018-10-15 NOTE — Telephone Encounter (Signed)
Left message on machine for patient to call back.  Received request from pharmacy to change the manufacturer of her levothyroxine. Need to confirm with patient that she can get labs in 6 weeks from time of change. Awaiting call back.

## 2018-10-15 NOTE — Telephone Encounter (Signed)
Spoke with patient. She is okay with having labs drawn in 6 weeks. Order placed. Okay given to the pharmacy. [place].

## 2018-11-29 LAB — TSH: TSH: 0.78 mIU/L

## 2018-11-29 NOTE — Telephone Encounter (Signed)
She could try valerian root or unisom for sleep.

## 2018-11-29 NOTE — Telephone Encounter (Signed)
TSH continues to be in normal range but a little closer to the HYPER thyroid side. How do you feel ? Do you need refills?

## 2018-11-29 NOTE — Telephone Encounter (Signed)
Does not appear that thyroid is making you more tired.   Are you sleeping good? Are you eating well? Are you exercising? Are you more depressed?

## 2018-12-03 NOTE — Telephone Encounter (Signed)
Patient made aware of recommendations.   

## 2019-04-15 ENCOUNTER — Ambulatory Visit (INDEPENDENT_AMBULATORY_CARE_PROVIDER_SITE_OTHER): Payer: Self-pay | Admitting: Physician Assistant

## 2019-04-15 ENCOUNTER — Other Ambulatory Visit: Payer: Self-pay

## 2019-04-15 ENCOUNTER — Encounter: Payer: Self-pay | Admitting: Physician Assistant

## 2019-04-15 VITALS — BP 117/85 | HR 90 | Ht 59.5 in | Wt 133.0 lb

## 2019-04-15 DIAGNOSIS — E039 Hypothyroidism, unspecified: Secondary | ICD-10-CM

## 2019-04-15 DIAGNOSIS — F32A Depression, unspecified: Secondary | ICD-10-CM

## 2019-04-15 DIAGNOSIS — F988 Other specified behavioral and emotional disorders with onset usually occurring in childhood and adolescence: Secondary | ICD-10-CM

## 2019-04-15 DIAGNOSIS — F411 Generalized anxiety disorder: Secondary | ICD-10-CM

## 2019-04-15 DIAGNOSIS — F329 Major depressive disorder, single episode, unspecified: Secondary | ICD-10-CM

## 2019-04-15 DIAGNOSIS — F410 Panic disorder [episodic paroxysmal anxiety] without agoraphobia: Secondary | ICD-10-CM

## 2019-04-15 MED ORDER — AMPHETAMINE-DEXTROAMPHET ER 20 MG PO CP24: 20.0000 mg | ORAL_CAPSULE | Freq: Every day | ORAL | 0 refills | Status: DC

## 2019-04-15 MED ORDER — LEVOTHYROXINE SODIUM 50 MCG PO TABS: 50.0000 ug | ORAL_TABLET | Freq: Every day | ORAL | 3 refills | Status: DC

## 2019-04-15 MED ORDER — CLONAZEPAM 0.5 MG PO TABS: 0.5000 mg | ORAL_TABLET | Freq: Two times a day (BID) | ORAL | 2 refills | Status: DC | PRN

## 2019-04-15 NOTE — Patient Instructions (Signed)

## 2019-04-15 NOTE — Progress Notes (Signed)
Subjective:    Patient ID: Danielle [name], female    DOB: 08/22/1990, 29 y.o.   MRN: [id number]  HPI  Patient is a 29 year old female with history of depression, anxiety, panic attacks, hypothyroidism and ADD who presents to the clinic for medication refills and to discuss ADD treatment.  Patient is very compliant with the levothyroxine.  She had her thyroid checked in August.  She needs refills.  Overall her anxiety and depression are fairly well controlled.  She uses Klonopin very sparingly as needed.  She would like to go back down to the 0.5 mg dose from the 1 mg dose.  She admits to having clusters of panic attacks and then she may go 2 to 3 weeks without any.  She is currently seeing a counselor and they keep revisiting the idea that her ADD could be causing some of her anxiety.  She is currently not working due to the [employer] pandemic.  She really would like to retry stimulants again.  She was treated with stimulants in high school and college and they worked well but she felt like she was very "serious". All the time.  She feels like now she might welcome the concentration.  She intermittently has some trouble sleeping.  .. Active Ambulatory Problems    Diagnosis Date Noted  . Frequent headaches 10/30/2014  . Facial numbness 10/30/2014  . Leg numbness 10/30/2014  . Generalized anxiety disorder 10/30/2014  . Hypothyroidism 11/11/2014  . Vitamin D deficiency 11/11/2014  . Panic attack 05/15/2016  . 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 No energy 07/20/2016  . Thyromegaly 07/20/2016  . Depression 01/04/2012  . Dyspareunia 04/24/2012  . Inflammatory bowel disease 09/25/2016  . Thrombocytopenia (HCC) 08/17/2011  . 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 Palpitations 12/28/2016  . Attention deficit disorder (ADD) without hyperactivity 04/16/2019   Resolved Ambulatory Problems    Diagnosis Date Noted  . No Resolved Ambulatory Problems   No Additional Past  Medical History    Review of Systems  All other systems reviewed and are negative.      Objective:   Physical Exam Vitals reviewed.  Constitutional:      Appearance: Normal appearance.  HENT:     Head: Normocephalic.  Cardiovascular:     Rate and Rhythm: Normal rate and regular rhythm.  Pulmonary:     Effort: Pulmonary effort is normal.  Neurological:     General: No focal deficit present.     Mental Status: She is alert and oriented to person, place, and time.  Psychiatric:        Mood and Affect: Mood normal.       .. Depression screen [facility] 2/9 04/15/2019 10/14/2018 04/15/2018 09/10/2017 12/27/2016  Decreased Interest 0 0 0 0 0  Down, Depressed, Hopeless 1 1 1 1 1   PHQ - 2 Score 1 1 1 1 1   Altered sleeping 2 2 2 1  -  Tired, decreased energy 2 1 2 1  -  Change in appetite 0 0 0 0 -  Feeling bad or failure about yourself  0 0 0 0 -  Trouble concentrating 0 0 0 0 -  Moving slowly or fidgety/restless 0 0 0 0 -  Suicidal thoughts 0 0 0 0 -  PHQ-9 Score 5 4 5 3  -  Difficult doing work/chores Somewhat difficult Somewhat difficult Somewhat difficult Somewhat difficult -   .. GAD 7 : Generalized Anxiety Score 04/15/2019 10/14/2018 04/15/2018 09/10/2017  Nervous, Anxious, on Edge 2  3 2 2   Control/stop worrying 0 2 2 1   Worry too much - different things 1 2 2 2   Trouble relaxing 1 1 1 3   Restless 0 0 0 0  Easily annoyed or irritable 0 1 1 2   Afraid - awful might happen 0 0 0 1  Total GAD 7 Score 4 9 8 11   Anxiety Difficulty Somewhat difficult Somewhat difficult Somewhat difficult Somewhat difficult        Assessment & Plan:  Marland KitchenMarland KitchenHayley was seen today for adhd.  Diagnoses and all orders for this visit:  Attention deficit disorder (ADD) without hyperactivity -     amphetamine-dextroamphetamine (ADDERALL XR) 20 MG 24 hr capsule; Take 1 capsule (20 mg total) by mouth daily.  Hypothyroidism, unspecified type -     levothyroxine (SYNTHROID) 50 MCG tablet; Take 1 tablet (50 mcg  total) by mouth daily.  Generalized anxiety disorder  Panic attack -     clonazePAM (KLONOPIN) 0.5 MG tablet; Take 1 tablet (0.5 mg total) by mouth 2 (two) times daily as needed for anxiety.  Depression, unspecified depression type   PHQ-9 and GAD-7 are stable.  I did decrease her Klonopin dose back down to 0.5 mg to use as needed.  Certainly we can try addition of Adderall.  Discussed potential side effects and to watch for more difficulty with sleeping.  We definitely need to follow this closely.  Follow-up in 1 month to see how she is doing on the 20 mg extended release dose.  Levothyroxine sent to the clinic for refill.   Patient is self-pay.  We will try her best to work around and only follow-up only absolutely have to.  Her 1 month follow-up could just be a Therapist, music.  Looked up good Rx for her where Adderall XR is $30.  This should be affordable.

## 2019-04-16 DIAGNOSIS — F988 Other specified behavioral and emotional disorders with onset usually occurring in childhood and adolescence: Secondary | ICD-10-CM | POA: Insufficient documentation

## 2019-05-13 ENCOUNTER — Encounter: Payer: Self-pay | Admitting: Physician Assistant

## 2019-05-13 DIAGNOSIS — F988 Other specified behavioral and emotional disorders with onset usually occurring in childhood and adolescence: Secondary | ICD-10-CM

## 2019-05-14 MED ORDER — AMPHETAMINE-DEXTROAMPHET ER 30 MG PO CP24: 30.0000 mg | ORAL_CAPSULE | ORAL | 0 refills | Status: DC

## 2019-05-14 NOTE — Telephone Encounter (Signed)
RX pended.  Last appt and last RX sent 04/15/19

## 2019-05-14 NOTE — Telephone Encounter (Signed)
Please schedule patient for follow up visit with Lesly Rubenstein, Thanks!

## 2019-05-14 NOTE — Telephone Encounter (Signed)
Can we set her up with virtual. I wanted to touch base with her about how she is doing on rx?

## 2019-05-16 MED ORDER — AMPHETAMINE-DEXTROAMPHET ER 30 MG PO CP24: 30.0000 mg | ORAL_CAPSULE | ORAL | 0 refills | Status: DC

## 2019-05-16 NOTE — Addendum Note (Signed)
Addended by: Jomarie Longs on: 05/16/2019 05:48 AM   Modules accepted: Orders

## 2019-06-10 ENCOUNTER — Telehealth: Payer: Self-pay | Admitting: Neurology

## 2019-06-10 NOTE — Telephone Encounter (Signed)
Patient left vm giving permission to give records to social services when they request them.

## 2019-06-12 ENCOUNTER — Encounter: Payer: Self-pay | Admitting: Physician Assistant

## 2019-06-13 MED ORDER — AMPHETAMINE-DEXTROAMPHET ER 30 MG PO CP24: 30.0000 mg | ORAL_CAPSULE | ORAL | 0 refills | Status: DC

## 2019-06-16 MED ORDER — AMPHETAMINE-DEXTROAMPHET ER 30 MG PO CP24: 30.0000 mg | ORAL_CAPSULE | ORAL | 0 refills | Status: DC

## 2019-06-16 NOTE — Telephone Encounter (Signed)
Canel one to [facility]. Sent HT too.

## 2019-06-16 NOTE — Addendum Note (Signed)
Addended by: Jomarie Longs on: 06/16/2019 05:02 PM   Modules accepted: Orders

## 2019-06-17 MED ORDER — AMPHETAMINE-DEXTROAMPHET ER 30 MG PO CP24: 30.0000 mg | ORAL_CAPSULE | ORAL | 0 refills | Status: DC

## 2019-06-17 NOTE — Telephone Encounter (Signed)
Cancelled all prescription at [facility].

## 2019-06-17 NOTE — Telephone Encounter (Signed)
RX was written for 07/14/2019 and 07/17/2019, none for this month. Please sign RX. Note to cancel 07/14/2019 RX.

## 2019-06-17 NOTE — Addendum Note (Signed)
Addended bySilvio Pate on: 06/17/2019 04:53 PM   Modules accepted: Orders

## 2019-08-11 ENCOUNTER — Other Ambulatory Visit: Payer: Self-pay | Admitting: Physician Assistant

## 2019-08-14 NOTE — Telephone Encounter (Signed)
JJ,   I saw there was some confusion with this. Do you know if patient has RX at pharmacy she can fill or does SB need to send new one in?

## 2019-08-18 ENCOUNTER — Encounter: Payer: Self-pay | Admitting: Physician Assistant

## 2019-08-18 MED ORDER — AMPHETAMINE-DEXTROAMPHET ER 30 MG PO CP24: 30.0000 mg | ORAL_CAPSULE | ORAL | 0 refills | Status: DC

## 2019-08-18 NOTE — Telephone Encounter (Signed)
Needs appt

## 2019-08-18 NOTE — Telephone Encounter (Signed)
Patient stated she talked to PCP in january and would not have insurance and that per PCP she could wait to follow up in July.

## 2019-08-18 NOTE — Telephone Encounter (Signed)
Due for refill 

## 2019-08-19 ENCOUNTER — Telehealth: Payer: Medicaid Other | Admitting: Physician Assistant

## 2019-09-19 ENCOUNTER — Telehealth (INDEPENDENT_AMBULATORY_CARE_PROVIDER_SITE_OTHER): Payer: Medicaid Other | Admitting: Physician Assistant

## 2019-09-19 ENCOUNTER — Encounter: Payer: Self-pay | Admitting: Physician Assistant

## 2019-09-19 VITALS — Ht 59.5 in | Wt 115.0 lb

## 2019-09-19 DIAGNOSIS — F410 Panic disorder [episodic paroxysmal anxiety] without agoraphobia: Secondary | ICD-10-CM

## 2019-09-19 DIAGNOSIS — E039 Hypothyroidism, unspecified: Secondary | ICD-10-CM

## 2019-09-19 DIAGNOSIS — F988 Other specified behavioral and emotional disorders with onset usually occurring in childhood and adolescence: Secondary | ICD-10-CM

## 2019-09-19 MED ORDER — AMPHETAMINE-DEXTROAMPHET ER 30 MG PO CP24: 30.0000 mg | ORAL_CAPSULE | ORAL | 0 refills | Status: DC

## 2019-09-19 MED ORDER — AMPHETAMINE-DEXTROAMPHET ER 30 MG PO CP24: 30.0000 mg | ORAL_CAPSULE | Freq: Every day | ORAL | 0 refills | Status: DC

## 2019-09-19 MED ORDER — CLONAZEPAM 0.5 MG PO TABS: 0.5000 mg | ORAL_TABLET | Freq: Two times a day (BID) | ORAL | 3 refills | Status: DC | PRN

## 2019-09-19 NOTE — Progress Notes (Signed)
Needs refills. No other issues PHQ9-GAD7 completed.  Adderall to [facility], Clonazepam to [facility] - pended

## 2019-09-19 NOTE — Progress Notes (Signed)
Patient ID: Danielle [name], female   DOB: [birth year], 29 y.o.   MRN: [id number] .Marland KitchenVirtual Visit via Video Note  I connected with Danielle [name] on 09/19/19 at  2:00 PM EDT by a video enabled telemedicine application and verified that I am speaking with the correct person using two identifiers.  Location: Patient: home Provider: clinic   I discussed the limitations of evaluation and management by telemedicine and the availability of in person appointments. The patient expressed understanding and agreed to proceed.  History of Present Illness: Pt is a 29 yo female with hypothyroidism, ADD, anxiety, panic attacks who presents to the clinic for 3 month refill.   Patient is doing fairly well.  She is using the Klonopin as needed for anxiety.  She does not have any increase in panic attacks.  She uses Adderall daily.  She denies any increased problems sleeping, palpitations, chest pain, headaches.  She feels like this is helping her significantly.  She needs refills.  She continues to take levothyroxine with no difficulty.  Has no problems or concerns to report.   Active Ambulatory Problems    Diagnosis Date Noted  . Frequent headaches 10/30/2014  . Facial numbness 10/30/2014  . Leg numbness 10/30/2014  . Generalized anxiety disorder 10/30/2014  . Hypothyroidism 11/11/2014  . Vitamin D deficiency 11/11/2014  . Panic attack 05/15/2016  . 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 No energy 07/20/2016  . Thyromegaly 07/20/2016  . Depression 01/04/2012  . Dyspareunia 04/24/2012  . Inflammatory bowel disease 09/25/2016  . Thrombocytopenia ([place]) 08/17/2011  . 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 Palpitations 12/28/2016  . Attention deficit disorder (ADD) without hyperactivity 04/16/2019   Resolved Ambulatory Problems    Diagnosis Date Noted  . No Resolved Ambulatory Problems   No Additional Past Medical History   Reviewed med, allergy, problem list.    Observations/Objective: No acute distress Normal mood and appearance.  Normal breathing.   No vitals.   .. Depression screen [facility] 2/9 09/19/2019 04/15/2019 10/14/2018 04/15/2018 09/10/2017  Decreased Interest 0 0 0 0 0  Down, Depressed, Hopeless 0 1 1 1 1   PHQ - 2 Score 0 1 1 1 1   Altered sleeping 2 2 2 2 1   Tired, decreased energy 1 2 1 2 1   Change in appetite 0 0 0 0 0  Feeling bad or failure about yourself  0 0 0 0 0  Trouble concentrating 0 0 0 0 0  Moving slowly or fidgety/restless 0 0 0 0 0  Suicidal thoughts 0 0 0 0 0  PHQ-9 Score 3 5 4 5 3   Difficult doing work/chores Not difficult at all Somewhat difficult Somewhat difficult Somewhat difficult Somewhat difficult   .Marland Kitchen GAD 7 : Generalized Anxiety Score 09/19/2019 04/15/2019 10/14/2018 04/15/2018  Nervous, Anxious, on Edge 2 2 3 2   Control/stop worrying 1 0 2 2  Worry too much - different things 0 1 2 2   Trouble relaxing 1 1 1 1   Restless 0 0 0 0  Easily annoyed or irritable 0 0 1 1  Afraid - awful might happen 0 0 0 0  Total GAD 7 Score 4 4 9 8   Anxiety Difficulty Not difficult at all Somewhat difficult Somewhat difficult Somewhat difficult       Assessment and Plan: Marland KitchenMarland KitchenHayley was seen today for adhd.  Diagnoses and all orders for this visit:  Attention deficit disorder (ADD) without hyperactivity -     amphetamine-dextroamphetamine (ADDERALL XR) 30 MG 24 hr  capsule; Take 1 capsule (30 mg total) by mouth every morning. -     amphetamine-dextroamphetamine (ADDERALL XR) 30 MG 24 hr capsule; Take 1 capsule (30 mg total) by mouth every morning. -     amphetamine-dextroamphetamine (ADDERALL XR) 30 MG 24 hr capsule; Take 1 capsule (30 mg total) by mouth daily.  Panic attack -     clonazePAM (KLONOPIN) 0.5 MG tablet; Take 1 tablet (0.5 mg total) by mouth 2 (two) times daily as needed for anxiety.  Hypothyroidism, unspecified type   Pt doing well. Refilled for 3 months. Ok for another 3 month refill.  Follow up in 6  months.  As needed klonapin.    Follow Up Instructions:    I discussed the assessment and treatment plan with the patient. The patient was provided an opportunity to ask questions and all were answered. The patient agreed with the plan and demonstrated an understanding of the instructions.   The patient was advised to call back or seek an in-person evaluation if the symptoms worsen or if the condition fails to improve as anticipated.  I provided  minutes of non-face-to-face time during this encounter.   Tandy Gaw, PA-C

## 2019-12-10 ENCOUNTER — Encounter: Payer: Self-pay | Admitting: Physician Assistant

## 2019-12-10 ENCOUNTER — Ambulatory Visit (INDEPENDENT_AMBULATORY_CARE_PROVIDER_SITE_OTHER): Payer: Self-pay | Admitting: Physician Assistant

## 2019-12-10 ENCOUNTER — Other Ambulatory Visit: Payer: Self-pay

## 2019-12-10 VITALS — BP 127/85 | HR 86 | Ht 59.5 in | Wt 114.0 lb

## 2019-12-10 DIAGNOSIS — R110 Nausea: Secondary | ICD-10-CM | POA: Insufficient documentation

## 2019-12-10 DIAGNOSIS — Z862 Personal history of diseases of the blood and blood-forming organs and certain disorders involving the immune mechanism: Secondary | ICD-10-CM | POA: Insufficient documentation

## 2019-12-10 DIAGNOSIS — Z630 Problems in relationship with spouse or partner: Secondary | ICD-10-CM | POA: Insufficient documentation

## 2019-12-10 DIAGNOSIS — F411 Generalized anxiety disorder: Secondary | ICD-10-CM

## 2019-12-10 DIAGNOSIS — K582 Mixed irritable bowel syndrome: Secondary | ICD-10-CM

## 2019-12-10 DIAGNOSIS — E039 Hypothyroidism, unspecified: Secondary | ICD-10-CM

## 2019-12-10 DIAGNOSIS — F988 Other specified behavioral and emotional disorders with onset usually occurring in childhood and adolescence: Secondary | ICD-10-CM

## 2019-12-10 DIAGNOSIS — Z79899 Other long term (current) drug therapy: Secondary | ICD-10-CM

## 2019-12-10 DIAGNOSIS — F410 Panic disorder [episodic paroxysmal anxiety] without agoraphobia: Secondary | ICD-10-CM

## 2019-12-10 MED ORDER — AMPHETAMINE-DEXTROAMPHET ER 30 MG PO CP24: 30.0000 mg | ORAL_CAPSULE | ORAL | 0 refills | Status: DC

## 2019-12-10 MED ORDER — BUSPIRONE HCL 5 MG PO TABS: 5.0000 mg | ORAL_TABLET | Freq: Three times a day (TID) | ORAL | 1 refills | Status: DC

## 2019-12-10 MED ORDER — CLONAZEPAM 0.5 MG PO TABS: 0.5000 mg | ORAL_TABLET | Freq: Two times a day (BID) | ORAL | 2 refills | Status: DC | PRN

## 2019-12-10 MED ORDER — AMPHETAMINE-DEXTROAMPHET ER 30 MG PO CP24: 30.0000 mg | ORAL_CAPSULE | Freq: Every day | ORAL | 0 refills | Status: DC

## 2019-12-10 NOTE — Progress Notes (Signed)
Subjective:    Patient ID: Danielle [name], female    DOB: July 02, 1990, 29 y.o.   MRN: [id number]  HPI  Resolved patient is a 29 year old female with anxiety and panic attacks as well as ADD HD who presents to the clinic for medication refills.  Patient is currently under a lot of extra stress.  She is going through a separation with her husband of 11 years.  She is in counseling weekly.  She is finding that her anxiety is much more heightened.  She is now living at home with her parents again.  She denies any suicidal thoughts or homicidal idealizations.  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  She feels like she might need something more for anxiety during this time but she does not want to take the SSRIs SNRIs.  She has not had good luck with this in the past.  She does continue to take Klonopin once a day as needed.  She does mention her IBS seemingly acting up a little more as well as intermittent nausea.  She denies any vomiting, fever, chills, body aches.  This nausea has been going on for years.  Denies any abdominal pain or reflux symptoms.  Seems to have more loose stools intermittently.  Overall her focus is great and fine.  She does not want to change her stimulant.  She does not feel like it is increasing her anxiety in any way.   .. Active Ambulatory Problems    Diagnosis Date Noted  . Frequent headaches 10/30/2014  . Facial numbness 10/30/2014  . Leg numbness 10/30/2014  . Generalized anxiety disorder 10/30/2014  . Hypothyroidism 11/11/2014  . Vitamin D deficiency 11/11/2014  . Panic attack 05/15/2016  . 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 No energy 07/20/2016  . Thyromegaly 07/20/2016  . Depression 01/04/2012  . Dyspareunia 04/24/2012  . Inflammatory bowel disease 09/25/2016  . Thrombocytopenia (HCC) 08/17/2011  . 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 Palpitations 12/28/2016  . Attention deficit disorder  (ADD) without hyperactivity 04/16/2019  . Marital problems 12/10/2019  . History of thrombocytopenia 12/10/2019  . Nausea 12/10/2019   Resolved Ambulatory Problems    Diagnosis Date Noted  . No Resolved Ambulatory Problems   No Additional Past Medical History     Review of Systems  All other systems reviewed and are negative.      Objective:   Physical Exam Vitals reviewed.  Constitutional:      Appearance: Normal appearance.  HENT:     Head: Normocephalic.  Cardiovascular:     Rate and Rhythm: Normal rate and regular rhythm.     Pulses: Normal pulses.     Heart sounds: Normal heart sounds.  Pulmonary:     Effort: Pulmonary effort is normal.     Breath sounds: Normal breath sounds.  Abdominal:     General: Bowel sounds are normal. There is no distension.     Palpations: Abdomen is soft.     Tenderness: There is no abdominal tenderness. There is no right CVA tenderness, left CVA tenderness or guarding.  Neurological:     General: No focal deficit present.     Mental Status: She is alert and oriented to person, place, and time.  Psychiatric:        Mood and Affect: Mood normal.       .. Depression screen [facility] 2/9 12/10/2019 09/19/2019 04/15/2019 10/14/2018 04/15/2018  Decreased Interest 1 0 0 0  0  Down, Depressed, Hopeless 1 0 1 1 1   PHQ - 2 Score 2 0 1 1 1   Altered sleeping 1 2 2 2 2   Tired, decreased energy 1 1 2 1 2   Change in appetite 2 0 0 0 0  Feeling bad or failure about yourself  0 0 0 0 0  Trouble concentrating 0 0 0 0 0  Moving slowly or fidgety/restless 0 0 0 0 0  Suicidal thoughts 0 0 0 0 0  PHQ-9 Score 6 3 5 4 5   Difficult doing work/chores Somewhat difficult Not difficult at all Somewhat difficult Somewhat difficult Somewhat difficult   . GAD 7 : Generalized Anxiety Score 12/10/2019 09/19/2019 04/15/2019 10/14/2018  Nervous, Anxious, on Edge 2 2 2 3   Control/stop worrying 1 1 0 2  Worry too much - different things 1 0 1 2  Trouble relaxing 2 1 1 1    Restless 0 0 0 0  Easily annoyed or irritable 1 0 0 1  Afraid - awful might happen 0 0 0 0  Total GAD 7 Score 7 4 4 9   Anxiety Difficulty Somewhat difficult Not difficult at all Somewhat difficult Somewhat difficult        Assessment & Plan:  Marland Kitchen11/8/2021[name] was seen today for adhd.  Diagnoses and all orders for this visit:  Marital problems -     busPIRone (BUSPAR) 5 MG tablet; Take 1 tablet (5 mg total) by mouth 3 (three) times daily.  Attention deficit disorder (ADD) without hyperactivity -     amphetamine-dextroamphetamine (ADDERALL XR) 30 MG 24 hr capsule; Take 1 capsule (30 mg total) by mouth every morning. -     amphetamine-dextroamphetamine (ADDERALL XR) 30 MG 24 hr capsule; Take 1 capsule (30 mg total) by mouth every morning. -     amphetamine-dextroamphetamine (ADDERALL XR) 30 MG 24 hr capsule; Take 1 capsule (30 mg total) by mouth daily. -     COMPLETE METABOLIC PANEL WITH GFR  Panic attack -     COMPLETE METABOLIC PANEL WITH GFR -     clonazePAM (KLONOPIN) 0.5 MG tablet; Take 1 tablet (0.5 mg total) by mouth 2 (two) times daily as needed for anxiety. -     busPIRone (BUSPAR) 5 MG tablet; Take 1 tablet (5 mg total) by mouth 3 (three) times daily.  History of thrombocytopenia -     CBC with Differential/Platelet  Medication management -     COMPLETE METABOLIC PANEL WITH GFR  Hypothyroidism, unspecified type -     TSH  Nausea  Irritable bowel syndrome with both constipation and diarrhea  GAD (generalized anxiety disorder) -     busPIRone (BUSPAR) 5 MG tablet; Take 1 tablet (5 mg total) by mouth 3 (three) times daily.   PHQ and GAD scores were elevated.  Likely this is due to her current separation situation.  Certainly I think this could be also causing her IBS flare and intermittent nausea.  Discussed options for anxiety control.  Decided to add BuSpar up to 3 times a day with Klonopin as needed.  Follow-up in 4 to 6 weeks.  Continue to go to counseling once a week.   Discussed deep breathing exercises, meditation, exercise.  Certainly follow-up if she would like to try other interventions for anxiety.  Patient is due for a TSH follow-up.  We will make sure this is in normal range and will adjust levothyroxine accordingly.  Refilled Adderall for 3 months.

## 2019-12-11 LAB — CBC WITH DIFFERENTIAL/PLATELET
Absolute Monocytes: 405 cells/uL (ref 200–950)
Basophils Absolute: 70 cells/uL (ref 0–200)
Basophils Relative: 1.3 %
Eosinophils Absolute: 92 cells/uL (ref 15–500)
Eosinophils Relative: 1.7 %
HCT: 46.5 % — ABNORMAL HIGH (ref 35.0–45.0)
Hemoglobin: 15.5 g/dL (ref 11.7–15.5)
Lymphs Abs: 1593 cells/uL (ref 850–3900)
MCH: 31.7 pg (ref 27.0–33.0)
MCHC: 33.3 g/dL (ref 32.0–36.0)
MCV: 95.1 fL (ref 80.0–100.0)
MPV: 11.6 fL (ref 7.5–12.5)
Monocytes Relative: 7.5 %
Neutro Abs: 3240 cells/uL (ref 1500–7800)
Neutrophils Relative %: 60 %
Platelets: 240 10*3/uL (ref 140–400)
RBC: 4.89 10*6/uL (ref 3.80–5.10)
RDW: 11.5 % (ref 11.0–15.0)
Total Lymphocyte: 29.5 %
WBC: 5.4 10*3/uL (ref 3.8–10.8)

## 2019-12-11 LAB — COMPLETE METABOLIC PANEL WITH GFR
AG Ratio: 1.6 (calc) (ref 1.0–2.5)
ALT: 17 U/L (ref 6–29)
AST: 21 U/L (ref 10–30)
Albumin: 5.1 g/dL (ref 3.6–5.1)
Alkaline phosphatase (APISO): 61 U/L (ref 31–125)
BUN: 10 mg/dL (ref 7–25)
CO2: 29 mmol/L (ref 20–32)
Calcium: 10.2 mg/dL (ref 8.6–10.2)
Chloride: 98 mmol/L (ref 98–110)
Creat: 0.6 mg/dL (ref 0.50–1.10)
GFR, Est African American: 143 mL/min/{1.73_m2} (ref >=60)
GFR, Est Non African American: 123 mL/min/{1.73_m2} (ref >=60)
Globulin: 3.1 g/dL (calc) (ref 1.9–3.7)
Glucose, Bld: 86 mg/dL (ref 65–99)
Potassium: 4.4 mmol/L (ref 3.5–5.3)
Sodium: 138 mmol/L (ref 135–146)
Total Bilirubin: 1.3 mg/dL — ABNORMAL HIGH (ref 0.2–1.2)
Total Protein: 8.2 g/dL — ABNORMAL HIGH (ref 6.1–8.1)

## 2019-12-11 LAB — TSH: TSH: 3.34 mIU/L

## 2019-12-11 NOTE — Progress Notes (Signed)
[place],   TSH is in normal range but upper limits of normal. You are taking thyroid medication first thing in morning without other medications or food? Optimal range is 1-2. We could increase medication a tad if you are doing the above.   Total protein a little elevated. Are you eating more protein or doing a supplement?

## 2020-01-28 ENCOUNTER — Telehealth: Payer: Self-pay | Admitting: *Deleted

## 2020-01-28 NOTE — Telephone Encounter (Signed)
Left patient an urgent message to call and reschedule appointment on 02/12/2020 with Dr. Penne Lash due to provider's schedule changing.Can schedule for another day with Penne Lash or another time on 02/12/2020 with Dr. Earlene Plater.

## 2020-02-12 ENCOUNTER — Ambulatory Visit: Payer: Medicaid Other | Admitting: Obstetrics & Gynecology

## 2020-02-16 ENCOUNTER — Other Ambulatory Visit: Payer: Self-pay

## 2020-02-16 ENCOUNTER — Ambulatory Visit (INDEPENDENT_AMBULATORY_CARE_PROVIDER_SITE_OTHER): Payer: Medicaid Other | Admitting: Obstetrics & Gynecology

## 2020-02-16 ENCOUNTER — Other Ambulatory Visit (HOSPITAL_COMMUNITY)
Admission: RE | Admit: 2020-02-16 | Discharge: 2020-02-16 | Disposition: A | Discharge status: Home / Self Care | Payer: Medicaid Other | Source: Ambulatory Visit | Attending: Obstetrics & Gynecology | Admitting: Obstetrics & Gynecology

## 2020-02-16 ENCOUNTER — Encounter: Payer: Self-pay | Admitting: Obstetrics & Gynecology

## 2020-02-16 VITALS — BP 133/90 | HR 120 | Resp 16 | Ht 60.0 in | Wt 114.0 lb

## 2020-02-16 DIAGNOSIS — L738 Other specified follicular disorders: Secondary | ICD-10-CM

## 2020-02-16 DIAGNOSIS — Z113 Encounter for screening for infections with a predominantly sexual mode of transmission: Secondary | ICD-10-CM | POA: Insufficient documentation

## 2020-02-16 DIAGNOSIS — Z30011 Encounter for initial prescription of contraceptive pills: Secondary | ICD-10-CM

## 2020-02-16 DIAGNOSIS — Z01411 Encounter for gynecological examination (general) (routine) with abnormal findings: Secondary | ICD-10-CM | POA: Insufficient documentation

## 2020-02-16 MED ORDER — NORETHIN ACE-ETH ESTRAD-FE 1-20 MG-MCG(24) PO TABS: 1.0000 | ORAL_TABLET | Freq: Every day | ORAL | 11 refills | Status: DC

## 2020-02-16 NOTE — Progress Notes (Signed)
Subjective:     Danielle [name] is a 29 y.o. female here for a routine exam.  Current complaints: intercourse less painful; new partner (negative STD panel ), wants to start OCPs (lowest hormones). Pt nervous about exam today due to traumatic gyn exam in ED years ago.  WE were able to successfully complete a pap smear 3 years ago   Gynecologic History Patient's last menstrual period was 02/01/2020. Contraception: coitus interruptus Last Pap: 2018. Results were: normal Last mammogram: n/a.   Obstetric History OB History  Gravida Para Term Preterm AB Living  0 0 0 0 0 0  SAB TAB Ectopic Multiple Live Births  0 0 0 0 0     The following portions of the patient's history were reviewed and updated as appropriate: allergies, current medications, past family history, past medical history, past social history, past surgical history and problem list.  Review of Systems Pertinent items noted in HPI and remainder of comprehensive ROS otherwise negative.    Objective:      Vitals:   02/16/20 1414  BP: 133/90  Pulse: (!) 120  Resp: 16  Weight: 114 lb (51.7 kg)  Height: 5' (1.524 m)   Vitals:  WNL General appearance: alert, cooperative and no distress  HEENT: Normocephalic, without obvious abnormality, atraumatic Eyes: negative Throat: lips, mucosa, and tongue normal; teeth and gums normal  Respiratory: Clear to auscultation bilaterally  CV: Regular rate and rhythm  Breasts:  Normal appearance, no masses or tenderness, no nipple retraction or dimpling  GI: Soft, non-tender; bowel sounds normal; no masses,  no organomegaly  GU: External Genitalia:  Tanner V, no lesion Urethra:  No prolapse   Vagina: Pink, normal rugae, no blood or discharge; increased muscular tone along bilateral vaginal walls.  Cervix: No CMT, no lesion  Uterus:  Normal size and contour, non tender  Adnexa: Normal, no masses, non tender  Musculoskeletal: No edema, redness or tenderness in the calves or thighs   Skin: echymosis of left hip (fell during intercourse); diffuse pseudofolliculitis of perineum and LE  Lymphatic: Axillary adenopathy: none     Psychiatric: Normal mood and behavior        Assessment:    Healthy female exam.   Vaginismus/vulvodynia     Plan:  1.  Pap with STD testing.  Pt in open relationship and wil be getting STD checks every 3 months.   2.  OCP start.  Condoms or abstinence until menses then start OCPs.  3.  Look into other forms of hair management other than shaving; derm referral if patient would like.

## 2020-02-17 ENCOUNTER — Other Ambulatory Visit: Payer: Self-pay | Admitting: Physician Assistant

## 2020-02-17 DIAGNOSIS — E039 Hypothyroidism, unspecified: Secondary | ICD-10-CM

## 2020-02-17 LAB — HEPATITIS C ANTIBODY
Hepatitis C Ab: NONREACTIVE
SIGNAL TO CUT-OFF: 0.01

## 2020-02-17 LAB — HEPATITIS B SURFACE ANTIGEN: Hepatitis B Surface Ag: NONREACTIVE

## 2020-02-17 LAB — HIV ANTIBODY (ROUTINE TESTING W REFLEX): HIV 1&2 Ab, 4th Generation: NONREACTIVE

## 2020-02-17 LAB — SYPHILIS: RPR W/REFLEX TO RPR TITER AND TREPONEMAL ANTIBODIES, TRADITIONAL SCREENING AND DIAGNOSIS ALGORITHM: RPR Ser Ql: NONREACTIVE

## 2020-02-18 LAB — CYTOLOGY - PAP
Chlamydia: NEGATIVE
Comment: NEGATIVE
Comment: NEGATIVE
Comment: NORMAL
Diagnosis: NEGATIVE
Neisseria Gonorrhea: NEGATIVE
Trichomonas: NEGATIVE

## 2020-03-02 ENCOUNTER — Telehealth: Payer: Self-pay | Admitting: Neurology

## 2020-03-02 DIAGNOSIS — E039 Hypothyroidism, unspecified: Secondary | ICD-10-CM

## 2020-03-02 NOTE — Telephone Encounter (Signed)
Patient called stating she thinks she needs thyroid labs.   She just had these done. Looks like thyroid medication declined due to patient having a years worth (should have until January 2022). Tried to call patient to make her aware. No answer. VM full. Mychart message sent to patient.

## 2020-03-05 MED ORDER — LEVOTHYROXINE SODIUM 50 MCG PO TABS: 50.0000 ug | ORAL_TABLET | Freq: Every day | ORAL | 0 refills | Status: DC

## 2020-03-05 NOTE — Telephone Encounter (Signed)
Patient called back, RX sent.

## 2020-03-05 NOTE — Addendum Note (Signed)
Addended byMauricio Po, Lesly Rubenstein L on: 03/05/2020 12:00 PM   Modules accepted: Orders

## 2020-03-17 ENCOUNTER — Encounter: Payer: Self-pay | Admitting: Physician Assistant

## 2020-03-17 ENCOUNTER — Telehealth (INDEPENDENT_AMBULATORY_CARE_PROVIDER_SITE_OTHER): Payer: Medicaid Other | Admitting: Physician Assistant

## 2020-03-17 DIAGNOSIS — N921 Excessive and frequent menstruation with irregular cycle: Secondary | ICD-10-CM

## 2020-03-17 DIAGNOSIS — F988 Other specified behavioral and emotional disorders with onset usually occurring in childhood and adolescence: Secondary | ICD-10-CM

## 2020-03-17 DIAGNOSIS — F410 Panic disorder [episodic paroxysmal anxiety] without agoraphobia: Secondary | ICD-10-CM

## 2020-03-17 MED ORDER — CLONAZEPAM 0.5 MG PO TABS: 0.5000 mg | ORAL_TABLET | Freq: Two times a day (BID) | ORAL | 2 refills | Status: DC | PRN

## 2020-03-17 MED ORDER — AMPHETAMINE-DEXTROAMPHET ER 30 MG PO CP24: 30.0000 mg | ORAL_CAPSULE | ORAL | 0 refills | Status: DC

## 2020-03-17 MED ORDER — AMPHETAMINE-DEXTROAMPHET ER 30 MG PO CP24: 30.0000 mg | ORAL_CAPSULE | Freq: Every day | ORAL | 0 refills | Status: DC

## 2020-03-17 NOTE — Progress Notes (Signed)
..Virtual Visit via Video Note  I connected with Danielle [name] on 03/17/20 at  7:10 AM EST by a telephone and verified that I am speaking with the correct person using two identifiers.  Location: Patient: home Provider: clinic   I discussed the limitations of evaluation and management by telemedicine and the availability of in person appointments. The patient expressed understanding and agreed to proceed.  History of Present Illness: Pt is a 29 yo female with IBS, hypothyroidism, anxiety, depression, ADD who presents to the clinic for medication refills.   ADD- pt doing well no concerns. No problems sleeping, palpitations, headaches. Doing well with job and work.   Stress level is better. She did not find a lot of benefit with buspar. She is using klonapin as needed.   Start OCP last week. Having some spotting and cramping. She wonders if normal.   Hypothyroidism-taking levothyroxine.   .. Active Ambulatory Problems    Diagnosis Date Noted  . Frequent headaches 10/30/2014  . Facial numbness 10/30/2014  . Leg numbness 10/30/2014  . Generalized anxiety disorder 10/30/2014  . Hypothyroidism 11/11/2014  . Vitamin D deficiency 11/11/2014  . Panic attack 05/15/2016  . 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 No energy 07/20/2016  . Thyromegaly 07/20/2016  . Depression 01/04/2012  . Dyspareunia 04/24/2012  . Inflammatory bowel disease 09/25/2016  . Thrombocytopenia (HCC) 08/17/2011  . 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 Palpitations 12/28/2016  . 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 Marital problems 12/10/2019  . History of thrombocytopenia 12/10/2019  . Nausea 12/10/2019  . Breakthrough bleeding 03/17/2020   Resolved Ambulatory Problems    Diagnosis Date Noted  . No Resolved Ambulatory Problems   No Additional Past Medical History   Reviewed med, allergy, problem list.     Observations/Objective: No acute  distress Normal mood and appearance.   .. Depression screen [facility] 2/9 12/10/2019 09/19/2019 04/15/2019 10/14/2018 04/15/2018  Decreased Interest 1 0 0 0 0  Down, Depressed, Hopeless 1 0 1 1 1   PHQ - 2 Score 2 0 1 1 1   Altered sleeping 1 2 2 2 2   Tired, decreased energy 1 1 2 1 2   Change in appetite 2 0 0 0 0  Feeling bad or failure about yourself  0 0 0 0 0  Trouble concentrating 0 0 0 0 0  Moving slowly or fidgety/restless 0 0 0 0 0  Suicidal thoughts 0 0 0 0 0  PHQ-9 Score 6 3 5 4 5   Difficult doing work/chores Somewhat difficult Not difficult at all Somewhat difficult Somewhat difficult Somewhat difficult   . GAD 7 : Generalized Anxiety Score 12/10/2019 09/19/2019 04/15/2019 10/14/2018  Nervous, Anxious, on Edge 2 2 2 3   Control/stop worrying 1 1 0 2  Worry too much - different things 1 0 1 2  Trouble relaxing 2 1 1 1   Restless 0 0 0 0  Easily annoyed or irritable 1 0 0 1  Afraid - awful might happen 0 0 0 0  Total GAD 7 Score 7 4 4 9   Anxiety Difficulty Somewhat difficult Not difficult at all Somewhat difficult Somewhat difficult      Assessment and Plan: Marland Kitchen11/8/2021[name] was seen today for adhd.  Diagnoses and all orders for this visit:  Attention deficit disorder (ADD) without hyperactivity -     amphetamine-dextroamphetamine (ADDERALL XR) 30 MG 24 hr capsule; Take 1 capsule (30 mg total) by mouth every morning. -     amphetamine-dextroamphetamine (  ADDERALL XR) 30 MG 24 hr capsule; Take 1 capsule (30 mg total) by mouth every morning. -     amphetamine-dextroamphetamine (ADDERALL XR) 30 MG 24 hr capsule; Take 1 capsule (30 mg total) by mouth daily.  Panic attack -     clonazePAM (KLONOPIN) 0.5 MG tablet; Take 1 tablet (0.5 mg total) by mouth 2 (two) times daily as needed for anxiety.  Breakthrough bleeding   Refilled adderall for 3 months.  Use klonapin sparingly. Discussed abuse potential. If needing klonapin more and more consider other options or even trying buspar but taking  twice a day regularly   Reassured BTB on OCP for first 1-3 months is fairly normal. If continuous bleeding/cramping reach out to GYN.   Follow up in 3 months.    Follow Up Instructions:    I discussed the assessment and treatment plan with the patient. The patient was provided an opportunity to ask questions and all were answered. The patient agreed with the plan and demonstrated an understanding of the instructions.   The patient was advised to call back or seek an in-person evaluation if the symptoms worsen or if the condition fails to improve as anticipated.  I provided 10  minutes of non-face-to-face time during this encounter.   Tandy Gaw, PA-C

## 2020-03-22 ENCOUNTER — Other Ambulatory Visit: Payer: Self-pay | Admitting: Neurology

## 2020-03-22 DIAGNOSIS — F988 Other specified behavioral and emotional disorders with onset usually occurring in childhood and adolescence: Secondary | ICD-10-CM

## 2020-03-22 MED ORDER — AMPHETAMINE-DEXTROAMPHET ER 30 MG PO CP24: 30.0000 mg | ORAL_CAPSULE | Freq: Every day | ORAL | 0 refills | Status: DC

## 2020-03-22 MED ORDER — AMPHETAMINE-DEXTROAMPHET ER 30 MG PO CP24: 30.0000 mg | ORAL_CAPSULE | ORAL | 0 refills | Status: DC

## 2020-03-22 NOTE — Telephone Encounter (Signed)
Patient called and states her Adderall was supposed to be sent to [facility], not [facility]. Can you re-sign?

## 2020-04-13 ENCOUNTER — Other Ambulatory Visit: Payer: Self-pay

## 2020-04-13 ENCOUNTER — Encounter: Payer: Self-pay | Admitting: Physician Assistant

## 2020-04-13 ENCOUNTER — Ambulatory Visit (INDEPENDENT_AMBULATORY_CARE_PROVIDER_SITE_OTHER): Payer: Self-pay | Admitting: Physician Assistant

## 2020-04-13 DIAGNOSIS — S2241XD Multiple fractures of ribs, right side, subsequent encounter for fracture with routine healing: Secondary | ICD-10-CM

## 2020-04-13 DIAGNOSIS — S2222XA Fracture of body of sternum, initial encounter for closed fracture: Secondary | ICD-10-CM | POA: Insufficient documentation

## 2020-04-13 DIAGNOSIS — S2222XD Fracture of body of sternum, subsequent encounter for fracture with routine healing: Secondary | ICD-10-CM

## 2020-04-13 DIAGNOSIS — S2241XA Multiple fractures of ribs, right side, initial encounter for closed fracture: Secondary | ICD-10-CM | POA: Insufficient documentation

## 2020-04-13 MED ORDER — TRAMADOL HCL 50 MG PO TABS: ORAL_TABLET | ORAL | 0 refills | Status: DC

## 2020-04-13 MED ORDER — CYCLOBENZAPRINE HCL 10 MG PO TABS
10.0000 mg | ORAL_TABLET | Freq: Three times a day (TID) | ORAL | 0 refills | Status: DC | PRN
Start: 2020-04-13 — End: 2021-02-07
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
## 2020-04-13 NOTE — Patient Instructions (Signed)
Rib Fracture  A rib fracture is a break or crack in one of the bones of the ribs. The ribs are like a cage that goes around your upper chest. A broken or cracked rib is often painful, but most do not cause other problems. Most rib fractures usually heal on their own in 1-3 months. What are the causes?  Doing movements over and over again with a lot of force, such as pitching a baseball or having a very bad cough.  A direct hit to the chest.  Cancer that has spread to the bones. What are the signs or symptoms?  Pain when you breathe in or cough.  Pain when someone presses on the injured area.  Feeling short of breath. How is this treated? Treatment depends on how bad the fracture is. In general:  Most rib fractures usually heal on their own in 1-3 months.  Healing may take longer if you have a cough or are doing activities that make the injury worse.  While you heal, you may be given medicines to control pain.  You will also be taught deep breathing exercises.  Very bad injuries may require a stay at the hospital or surgery. Follow these instructions at home: Managing pain, stiffness, and swelling  If told, put ice on the injured area. To do this: ? Put ice in a plastic bag. ? Place a towel between your skin and the bag. ? Leave the ice on for 20 minutes, 2-3 times a day. ? Take off the ice if your skin turns bright red. This is very important. If you cannot feel pain, heat, or cold, you have a greater risk of damage to the area.  Take over-the-counter and prescription medicines only as told by your doctor. Activity  Avoid activities that cause pain to the injured area. Protect your injured area.  Slowly increase activity as told by your doctor. General instructions  Do deep breathing exercises as told by your doctor. You may be told to: ? Take deep breaths many times a day. ? Cough several times a day while hugging a pillow. ? Use a device (incentive spirometer) to do  deep breathing many times a day.  Drink enough fluid to keep your pee (urine) clear or pale yellow.  Do not wear a rib belt or binder.  Keep all follow-up visits. Contact a doctor if:  You have a fever. Get help right away if:  You have trouble breathing.  You are short of breath.  You cannot stop coughing.  You cough up thick or bloody spit.  You feel like you may vomit (nauseous), vomit, or have belly (abdominal) pain.  Your pain gets worse and medicine does not help. These symptoms may be an emergency. Get help right away. Call your local emergency services (911 in the [place]  Do not wait to see if the symptoms will go away.  Do not drive yourself to the hospital. Summary  A rib fracture is a break or crack in one of the bones of the ribs.  Apply ice to the injured area and take medicines for pain as told by your doctor.  Take deep breaths and cough several times a day. Hug a pillow every time you cough. This information is not intended to replace advice given to you by your health care provider. Make sure you discuss any questions you have with your health care provider. Document Revised: 07/11/2019 Document Reviewed: 07/11/2019 Elsevier Patient Education  2021 Elsevier Inc.  

## 2020-04-14 ENCOUNTER — Encounter: Payer: Self-pay | Admitting: Physician Assistant

## 2020-04-29 ENCOUNTER — Other Ambulatory Visit: Payer: Self-pay | Admitting: Physician Assistant

## 2020-04-29 DIAGNOSIS — F410 Panic disorder [episodic paroxysmal anxiety] without agoraphobia: Secondary | ICD-10-CM

## 2020-04-29 NOTE — Telephone Encounter (Signed)
Last written 03/17/2020 #30 with 2 refills  [facility] [address] [place] Parents request [facility], if you want to send there I will call and cancel other RX

## 2020-04-30 NOTE — Telephone Encounter (Signed)
LMOM at [facility] cancelling RX.

## 2020-04-30 NOTE — Telephone Encounter (Signed)
Ok will send. Please cancer other rx.

## 2020-07-06 ENCOUNTER — Other Ambulatory Visit: Payer: Self-pay | Admitting: Neurology

## 2020-07-06 DIAGNOSIS — F988 Other specified behavioral and emotional disorders with onset usually occurring in childhood and adolescence: Secondary | ICD-10-CM

## 2020-07-06 MED ORDER — AMPHETAMINE-DEXTROAMPHET ER 30 MG PO CP24: 30.0000 mg | ORAL_CAPSULE | ORAL | 0 refills | Status: DC

## 2020-07-06 NOTE — Telephone Encounter (Signed)
Patient made aware.

## 2020-07-06 NOTE — Telephone Encounter (Signed)
Patient left vm stating she picked up Adderall for February and March but never filled January RX. She is wondering if she can have one more prescription before she has to make a follow up appt? Please advise.

## 2020-07-23 ENCOUNTER — Other Ambulatory Visit: Payer: Self-pay | Admitting: Physician Assistant

## 2020-07-23 DIAGNOSIS — F410 Panic disorder [episodic paroxysmal anxiety] without agoraphobia: Secondary | ICD-10-CM

## 2020-08-16 ENCOUNTER — Ambulatory Visit (INDEPENDENT_AMBULATORY_CARE_PROVIDER_SITE_OTHER): Payer: Self-pay | Admitting: Physician Assistant

## 2020-08-16 ENCOUNTER — Encounter: Payer: Self-pay | Admitting: Physician Assistant

## 2020-08-16 ENCOUNTER — Other Ambulatory Visit: Payer: Self-pay

## 2020-08-16 VITALS — BP 119/78 | HR 93 | Ht 60.0 in | Wt 113.0 lb

## 2020-08-16 DIAGNOSIS — F410 Panic disorder [episodic paroxysmal anxiety] without agoraphobia: Secondary | ICD-10-CM

## 2020-08-16 DIAGNOSIS — E039 Hypothyroidism, unspecified: Secondary | ICD-10-CM

## 2020-08-16 DIAGNOSIS — F988 Other specified behavioral and emotional disorders with onset usually occurring in childhood and adolescence: Secondary | ICD-10-CM

## 2020-08-16 DIAGNOSIS — F411 Generalized anxiety disorder: Secondary | ICD-10-CM

## 2020-08-16 MED ORDER — AMPHETAMINE-DEXTROAMPHET ER 30 MG PO CP24: 30.0000 mg | ORAL_CAPSULE | ORAL | 0 refills | Status: DC

## 2020-08-16 MED ORDER — AMPHETAMINE-DEXTROAMPHET ER 30 MG PO CP24: 30.0000 mg | ORAL_CAPSULE | Freq: Every day | ORAL | 0 refills | Status: DC

## 2020-08-16 MED ORDER — CLONAZEPAM 0.5 MG PO TABS: ORAL_TABLET | ORAL | 5 refills | Status: DC

## 2020-08-16 MED ORDER — LEVOTHYROXINE SODIUM 50 MCG PO TABS: 50.0000 ug | ORAL_TABLET | Freq: Every day | ORAL | 0 refills | Status: DC
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
## 2020-11-08 ENCOUNTER — Telehealth: Payer: Self-pay | Admitting: Neurology

## 2020-11-08 DIAGNOSIS — E039 Hypothyroidism, unspecified: Secondary | ICD-10-CM

## 2020-11-08 NOTE — Telephone Encounter (Signed)
Patient called and is almost out of thyroid medication, was told to get TSH drawn at that time. Reinette Cuneo's last note states the same. TSH ordered. Patient made aware she can come here to have drawn or downstairs at [facility].

## 2020-11-09 ENCOUNTER — Other Ambulatory Visit: Payer: Self-pay | Admitting: Physician Assistant

## 2020-11-09 DIAGNOSIS — E039 Hypothyroidism, unspecified: Secondary | ICD-10-CM

## 2020-11-09 LAB — TSH: TSH: 1.81 mIU/L

## 2020-11-09 MED ORDER — LEVOTHYROXINE SODIUM 50 MCG PO TABS: 50.0000 ug | ORAL_TABLET | Freq: Every day | ORAL | 3 refills | Status: DC

## 2020-11-09 NOTE — Progress Notes (Signed)
TSH looks really good. I think this is a perfect dose for you. I sent 90 day refills to the pharmacy.

## 2020-11-16 ENCOUNTER — Other Ambulatory Visit: Payer: Self-pay | Admitting: Neurology

## 2020-11-16 DIAGNOSIS — F988 Other specified behavioral and emotional disorders with onset usually occurring in childhood and adolescence: Secondary | ICD-10-CM

## 2020-11-16 MED ORDER — AMPHETAMINE-DEXTROAMPHET ER 30 MG PO CP24: 30.0000 mg | ORAL_CAPSULE | ORAL | 0 refills | Status: DC

## 2020-11-16 MED ORDER — AMPHETAMINE-DEXTROAMPHET ER 30 MG PO CP24: 30.0000 mg | ORAL_CAPSULE | Freq: Every day | ORAL | 0 refills | Status: DC

## 2020-11-16 NOTE — Telephone Encounter (Signed)
Patient called for Adderall refill. Last written 10/14/2020 #30 no refills. Last appt in May, at that time Lesly Rubenstein wrote "Refilled Adderall for 3 months.  Okay for another refill to last 6 months between appointments"

## 2020-11-16 NOTE — Telephone Encounter (Signed)
LMOM letting patient know. 

## 2021-01-17 ENCOUNTER — Other Ambulatory Visit: Payer: Self-pay

## 2021-01-17 DIAGNOSIS — F988 Other specified behavioral and emotional disorders with onset usually occurring in childhood and adolescence: Secondary | ICD-10-CM

## 2021-01-17 MED ORDER — AMPHETAMINE-DEXTROAMPHET ER 30 MG PO CP24: 30.0000 mg | ORAL_CAPSULE | ORAL | 0 refills | Status: DC

## 2021-01-17 NOTE — Telephone Encounter (Signed)
Patient left a vm msg requesting a med refill for adderall. Per patient, she has an upcoming appointment on 02/16/2021. Please send rx to [facility]. Rx pended.

## 2021-02-07 ENCOUNTER — Other Ambulatory Visit: Payer: Self-pay

## 2021-02-07 ENCOUNTER — Ambulatory Visit (INDEPENDENT_AMBULATORY_CARE_PROVIDER_SITE_OTHER): Payer: Self-pay | Admitting: Physician Assistant

## 2021-02-07 ENCOUNTER — Encounter: Payer: Self-pay | Admitting: Physician Assistant

## 2021-02-07 VITALS — BP 118/83 | HR 78 | Ht 60.0 in | Wt 116.0 lb

## 2021-02-07 DIAGNOSIS — E559 Vitamin D deficiency, unspecified: Secondary | ICD-10-CM

## 2021-02-07 DIAGNOSIS — E039 Hypothyroidism, unspecified: Secondary | ICD-10-CM

## 2021-02-07 DIAGNOSIS — Z131 Encounter for screening for diabetes mellitus: Secondary | ICD-10-CM

## 2021-02-07 DIAGNOSIS — Z13220 Encounter for screening for lipoid disorders: Secondary | ICD-10-CM

## 2021-02-07 DIAGNOSIS — F988 Other specified behavioral and emotional disorders with onset usually occurring in childhood and adolescence: Secondary | ICD-10-CM

## 2021-02-07 DIAGNOSIS — F410 Panic disorder [episodic paroxysmal anxiety] without agoraphobia: Secondary | ICD-10-CM

## 2021-02-07 DIAGNOSIS — Z79899 Other long term (current) drug therapy: Secondary | ICD-10-CM

## 2021-02-07 DIAGNOSIS — Z862 Personal history of diseases of the blood and blood-forming organs and certain disorders involving the immune mechanism: Secondary | ICD-10-CM

## 2021-02-07 MED ORDER — AMPHETAMINE-DEXTROAMPHETAMINE 20 MG PO TABS: 20.0000 mg | ORAL_TABLET | Freq: Every day | ORAL | 0 refills | Status: DC

## 2021-02-07 MED ORDER — CLONAZEPAM 0.5 MG PO TABS: ORAL_TABLET | ORAL | 5 refills | Status: DC
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
## 2021-02-08 ENCOUNTER — Telehealth: Payer: Self-pay | Admitting: Neurology

## 2021-02-08 ENCOUNTER — Other Ambulatory Visit: Payer: Self-pay | Admitting: Obstetrics & Gynecology

## 2021-02-08 NOTE — Telephone Encounter (Signed)
Patient called and left vm stating Adderall 20 mg IR is on back order. She isn't sure whether she should go back to 30 mg XR until she can get medication?   I tried to call patient back to see if she wanted to go back to old dose or if she wants to call around to see if she can find a different pharmacy to send this to. No answer. VM full. Mychart message sent to patient.

## 2021-02-16 ENCOUNTER — Ambulatory Visit: Payer: Self-pay | Admitting: Physician Assistant

## 2021-02-21 ENCOUNTER — Encounter: Payer: Self-pay | Admitting: Obstetrics & Gynecology

## 2021-02-21 ENCOUNTER — Other Ambulatory Visit (HOSPITAL_COMMUNITY)
Admission: RE | Admit: 2021-02-21 | Discharge: 2021-02-21 | Disposition: A | Discharge status: Home / Self Care | Payer: Medicaid Other | Source: Ambulatory Visit | Attending: Obstetrics & Gynecology | Admitting: Obstetrics & Gynecology

## 2021-02-21 ENCOUNTER — Ambulatory Visit (INDEPENDENT_AMBULATORY_CARE_PROVIDER_SITE_OTHER): Payer: Medicaid Other | Admitting: Obstetrics & Gynecology

## 2021-02-21 ENCOUNTER — Other Ambulatory Visit: Payer: Self-pay

## 2021-02-21 VITALS — BP 105/67 | HR 95 | Resp 16 | Ht 60.0 in | Wt 116.0 lb

## 2021-02-21 DIAGNOSIS — Z3041 Encounter for surveillance of contraceptive pills: Secondary | ICD-10-CM | POA: Diagnosis not present

## 2021-02-21 DIAGNOSIS — Z113 Encounter for screening for infections with a predominantly sexual mode of transmission: Secondary | ICD-10-CM | POA: Diagnosis present

## 2021-02-21 DIAGNOSIS — N942 Vaginismus: Secondary | ICD-10-CM

## 2021-02-21 DIAGNOSIS — Z01411 Encounter for gynecological examination (general) (routine) with abnormal findings: Secondary | ICD-10-CM

## 2021-02-21 NOTE — Progress Notes (Signed)
Subjective:     Danielle [name] is a 30 y.o. female here for a routine exam.  Current complaints: Hasn't had menses since beginning of Sept (not sexually active)  On OCPs (24/4).  Patient is fine not having a period, as she just wanted to be sure it was normal.  Patient has not been sexually active for several months.  She would like a complete STD testing.  Patient still having some issues with vaginismus and dyspareunia.   Gynecologic History Patient's last menstrual period was 12/15/2020 (approximate). Contraception: OCP (estrogen/progesterone) Last Pap: 2021. Results were: normal Last mammogram: n/a.   Obstetric History OB History  Gravida Para Term Preterm AB Living  0 0 0 0 0 0  SAB IAB Ectopic Multiple Live Births  0 0 0 0 0     The following portions of the patient's history were reviewed and updated as appropriate: allergies, current medications, past family history, past medical history, past social history, past surgical history, and problem list.  Review of Systems Pertinent items noted in HPI and remainder of comprehensive ROS otherwise negative.    Objective:    BP 105/67   Pulse 95   Resp 16   Ht 5' (1.524 m)   Wt 116 lb (52.6 kg)   LMP 12/15/2020 (Approximate)   BMI 22.65 kg/m   General Appearance:    Alert, cooperative, no distress, appears stated age  Head:    Normocephalic, without obvious abnormality, atraumatic  Eyes:    PERRL, conjunctiva/corneas clear, EOM's intact, fundi    benign, both eyes  Ears:    Normal TM's and external ear canals, both ears  Nose:   Nares normal, septum midline, mucosa normal, no drainage    or sinus tenderness  Throat:   Lips, mucosa, and tongue normal; teeth and gums normal  Neck:   Supple, symmetrical, trachea midline, no adenopathy;    thyroid:  no enlargement/tenderness/nodules; no carotid   bruit or JVD  Back:     Symmetric, no curvature, ROM normal, no CVA tenderness  Lungs:     Clear to auscultation bilaterally,  respirations unlabored  Chest Wall:    No tenderness or deformity   Heart:    Regular rate and rhythm, S1 and S2 normal, no murmur, rub   or gallop  Breast Exam:    No tenderness, masses, or nipple abnormality  Abdomen:     Soft, non-tender, bowel sounds active all four quadrants,    no masses, no organomegaly  Genitalia:   Tanner V Vulva:  No lesion Vagina:  tight introitus, +vaginismus, " tone and pain in pelvic diaphragm Pt deferred speculum and bimanual due to pain and pap smear not needed today.      Extremities:   Extremities normal, atraumatic, no cyanosis or edema  Pulses:   2+ and symmetric all extremities  Skin:   Skin color, texture, turgor normal, no rashes or lesions  Lymph nodes:   No lymphadenopathy   Neurologic:   CNII-XII intact, normal strength, sensation and reflexes    throughout      Assessment:    Healthy female exam.  vaginismus   Plan:    1.  STD screening 2.  Pap nml 2021 3.  Cont OCPs 4.  Start home exercises again from physical therapy and remake appointment.  Vaginismus is significant. 5.  Suggest reading [facility] and getting an appointment with the sexual therapist at [facility].

## 2021-02-21 NOTE — Patient Instructions (Signed)
Book [facility]  Restart PT

## 2021-02-22 LAB — CERVICOVAGINAL ANCILLARY ONLY
Bacterial Vaginitis (gardnerella): NEGATIVE
Candida Glabrata: NEGATIVE
Candida Vaginitis: NEGATIVE
Chlamydia: NEGATIVE
Comment: NEGATIVE
Comment: NEGATIVE
Comment: NEGATIVE
Comment: NEGATIVE
Comment: NEGATIVE
Comment: NORMAL
Neisseria Gonorrhea: NEGATIVE
Trichomonas: NEGATIVE

## 2021-02-22 LAB — HEPATITIS C ANTIBODY
Hepatitis C Ab: NONREACTIVE
SIGNAL TO CUT-OFF: 0.05 (ref <1.00)

## 2021-02-22 LAB — RPR: RPR Ser Ql: NONREACTIVE

## 2021-02-22 LAB — HIV ANTIBODY (ROUTINE TESTING W REFLEX): HIV 1&2 Ab, 4th Generation: NONREACTIVE

## 2021-02-22 LAB — HEPATITIS B SURFACE ANTIGEN: Hepatitis B Surface Ag: NONREACTIVE

## 2021-03-08 ENCOUNTER — Other Ambulatory Visit: Payer: Self-pay | Admitting: Physician Assistant

## 2021-03-08 DIAGNOSIS — F988 Other specified behavioral and emotional disorders with onset usually occurring in childhood and adolescence: Secondary | ICD-10-CM

## 2021-03-08 MED ORDER — AMPHETAMINE-DEXTROAMPHETAMINE 20 MG PO TABS: 20.0000 mg | ORAL_TABLET | Freq: Two times a day (BID) | ORAL | 0 refills | Status: DC

## 2021-03-10 ENCOUNTER — Telehealth: Payer: Self-pay | Admitting: Neurology

## 2021-03-10 NOTE — Telephone Encounter (Signed)
Patient called stating Adderall is on back order at her pharmacy - [name]. She will call around and let us know where else to send RX so she can get this filled. IT is completely out.

## 2021-03-11 NOTE — Telephone Encounter (Signed)
Patient called back and left vm stating she tried to call [employer] and they wouldn't tell her because it is a controlled substance. I called them and they don't have in stock.

## 2021-03-15 ENCOUNTER — Encounter: Payer: Self-pay | Admitting: Physician Assistant

## 2021-03-15 DIAGNOSIS — F988 Other specified behavioral and emotional disorders with onset usually occurring in childhood and adolescence: Secondary | ICD-10-CM

## 2021-03-16 MED ORDER — AMPHETAMINE-DEXTROAMPHETAMINE 20 MG PO TABS: 20.0000 mg | ORAL_TABLET | Freq: Two times a day (BID) | ORAL | 0 refills | Status: DC

## 2021-04-06 ENCOUNTER — Other Ambulatory Visit: Payer: Self-pay | Admitting: Obstetrics & Gynecology

## 2021-04-07 ENCOUNTER — Other Ambulatory Visit: Payer: Self-pay

## 2021-04-07 DIAGNOSIS — Z789 Other specified health status: Secondary | ICD-10-CM

## 2021-04-07 MED ORDER — NORETHIN ACE-ETH ESTRAD-FE 1-20 MG-MCG(24) PO TABS: 1.0000 | ORAL_TABLET | Freq: Every day | ORAL | 11 refills | Status: DC

## 2021-05-10 ENCOUNTER — Other Ambulatory Visit: Payer: Self-pay

## 2021-05-10 ENCOUNTER — Ambulatory Visit (INDEPENDENT_AMBULATORY_CARE_PROVIDER_SITE_OTHER): Payer: Self-pay | Admitting: Physician Assistant

## 2021-05-10 ENCOUNTER — Encounter: Payer: Self-pay | Admitting: Physician Assistant

## 2021-05-10 DIAGNOSIS — F988 Other specified behavioral and emotional disorders with onset usually occurring in childhood and adolescence: Secondary | ICD-10-CM

## 2021-05-10 MED ORDER — AMPHETAMINE-DEXTROAMPHETAMINE 20 MG PO TABS: 20.0000 mg | ORAL_TABLET | Freq: Two times a day (BID) | ORAL | 0 refills | Status: DC
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
## 2021-08-08 ENCOUNTER — Ambulatory Visit (INDEPENDENT_AMBULATORY_CARE_PROVIDER_SITE_OTHER): Payer: Self-pay | Admitting: Physician Assistant

## 2021-08-08 ENCOUNTER — Encounter: Payer: Self-pay | Admitting: Physician Assistant

## 2021-08-08 VITALS — BP 145/84 | HR 106 | Ht 60.0 in | Wt 109.0 lb

## 2021-08-08 DIAGNOSIS — J40 Bronchitis, not specified as acute or chronic: Secondary | ICD-10-CM

## 2021-08-08 DIAGNOSIS — F988 Other specified behavioral and emotional disorders with onset usually occurring in childhood and adolescence: Secondary | ICD-10-CM

## 2021-08-08 DIAGNOSIS — J329 Chronic sinusitis, unspecified: Secondary | ICD-10-CM

## 2021-08-08 DIAGNOSIS — R030 Elevated blood-pressure reading, without diagnosis of hypertension: Secondary | ICD-10-CM

## 2021-08-08 MED ORDER — AMPHETAMINE-DEXTROAMPHETAMINE 20 MG PO TABS: 20.0000 mg | ORAL_TABLET | Freq: Two times a day (BID) | ORAL | 0 refills | Status: DC

## 2021-08-08 MED ORDER — PREDNISONE 20 MG PO TABS: 20.0000 mg | ORAL_TABLET | Freq: Two times a day (BID) | ORAL | 0 refills | Status: DC

## 2021-08-08 MED ORDER — AZITHROMYCIN 250 MG PO TABS: ORAL_TABLET | ORAL | 0 refills | Status: DC
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
## 2021-08-08 NOTE — Patient Instructions (Signed)
Acute Bronchitis, Adult ? ?Acute bronchitis is sudden inflammation of the main airways (bronchi) that come off the windpipe (trachea) in the lungs. The swelling causes the airways to get smaller and make more mucus than normal. This can make it hard to breathe and can cause coughing or noisy breathing (wheezing). ?Acute bronchitis may last several weeks. The cough may last longer. Allergies, asthma, and exposure to smoke may make the condition worse. ?What are the causes? ?This condition can be caused by germs and by substances that irritate the lungs, including: ?Cold and flu viruses. The most common cause of this condition is the virus that causes the common cold. ?Bacteria. This is less common. ?Breathing in substances that irritate the lungs, including: ?Smoke from cigarettes and other forms of tobacco. ?Dust and pollen. ?Fumes from household cleaning products, gases, or burned fuel. ?Indoor or outdoor air pollution. ?What increases the risk? ?The following factors may make you more likely to develop this condition: ?A weak body's defense system, also called the immune system. ?A condition that affects your lungs and breathing, such as asthma. ?What are the signs or symptoms? ?Common symptoms of this condition include: ?Coughing. This may bring up clear, yellow, or green mucus from your lungs (sputum). ?Wheezing. ?Runny or stuffy nose. ?Having too much mucus in your lungs (chest congestion). ?Shortness of breath. ?Aches and pains, including sore throat or chest. ?How is this diagnosed? ?This condition is usually diagnosed based on: ?Your symptoms and medical history. ?A physical exam. ?You may also have other tests, including tests to rule out other conditions, such as pneumonia. These tests include: ?A test of lung function. ?Test of a mucus sample to look for the presence of bacteria. ?Tests to check the oxygen level in your blood. ?Blood tests. ?Chest X-ray. ?How is this treated? ?Most cases of acute  bronchitis clear up over time without treatment. Your health care provider may recommend: ?Drinking more fluids to help thin your mucus so it is easier to cough up. ?Taking inhaled medicine (inhaler) to improve air flow in and out of your lungs. ?Using a vaporizer or a humidifier. These are machines that add water to the air to help you breathe better. ?Taking a medicine that thins mucus and clears congestion (expectorant). ?Taking a medicine that prevents or stops coughing (cough suppressant). ?It is notcommon to take an antibiotic medicine for this condition. ?Follow these instructions at home: ? ?Take over-the-counter and prescription medicines only as told by your health care provider. ?Use an inhaler, vaporizer, or humidifier as told by your health care provider. ?Take two teaspoons (10 mL) of honey at bedtime to lessen coughing at night. ?Drink enough fluid to keep your urine pale yellow. ?Do not use any products that contain nicotine or tobacco. These products include cigarettes, chewing tobacco, and vaping devices, such as e-cigarettes. If you need help quitting, ask your health care provider. ?Get plenty of rest. ?Return to your normal activities as told by your health care provider. Ask your health care provider what activities are safe for you. ?Keep all follow-up visits. This is important. ?How is this prevented? ?To lower your risk of getting this condition again: ?Wash your hands often with soap and water for at least 20 seconds. If soap and water are not available, use hand sanitizer. ?Avoid contact with people who have cold symptoms. ?Try not to touch your mouth, nose, or eyes with your hands. ?Avoid breathing in smoke or chemical fumes. Breathing smoke or chemical fumes will make your   condition worse. ?Get the flu shot every year. ?Contact a health care provider if: ?Your symptoms do not improve after 2 weeks. ?You have trouble coughing up the mucus. ?Your cough keeps you awake at night. ?You have a  fever. ?Get help right away if you: ?Cough up blood. ?Feel pain in your chest. ?Have severe shortness of breath. ?Faint or keep feeling like you are going to faint. ?Have a severe headache. ?Have a fever or chills that get worse. ?These symptoms may represent a serious problem that is an emergency. Do not wait to see if the symptoms will go away. Get medical help right away. Call your local emergency services (911 in the [place] Do not drive yourself to the hospital. ?Summary ?Acute bronchitis is inflammation of the main airways (bronchi) that come off the windpipe (trachea) in the lungs. The swelling causes the airways to get smaller and make more mucus than normal. ?Drinking more fluids can help thin your mucus so it is easier to cough up. ?Take over-the-counter and prescription medicines only as told by your health care provider. ?Do not use any products that contain nicotine or tobacco. These products include cigarettes, chewing tobacco, and vaping devices, such as e-cigarettes. If you need help quitting, ask your health care provider. ?Contact a health care provider if your symptoms do not improve after 2 weeks. ?This information is not intended to replace advice given to you by your health care provider. Make sure you discuss any questions you have with your health care provider. ?Document Revised: 07/21/2020 Document Reviewed: 07/21/2020 ?Elsevier Patient Education ? 2023 [employer] ? ?

## 2021-10-13 ENCOUNTER — Other Ambulatory Visit: Payer: Self-pay | Admitting: Physician Assistant

## 2021-10-13 DIAGNOSIS — F410 Panic disorder [episodic paroxysmal anxiety] without agoraphobia: Secondary | ICD-10-CM

## 2021-10-13 NOTE — Telephone Encounter (Signed)
Last written 02/07/2021 #60 with 5 refills Last appt 08/08/2021 (acute) 05/10/2021 (reg f/u)

## 2021-11-14 ENCOUNTER — Other Ambulatory Visit: Payer: Self-pay | Admitting: Neurology

## 2021-11-14 DIAGNOSIS — F988 Other specified behavioral and emotional disorders with onset usually occurring in childhood and adolescence: Secondary | ICD-10-CM

## 2021-11-14 NOTE — Telephone Encounter (Signed)
Last written 10/05/2021 for one month Last appt 08/08/2021, told she did not need follow up til November. Pended for next 3 months.

## 2021-11-15 ENCOUNTER — Other Ambulatory Visit: Payer: Self-pay | Admitting: Physician Assistant

## 2021-11-15 DIAGNOSIS — F988 Other specified behavioral and emotional disorders with onset usually occurring in childhood and adolescence: Secondary | ICD-10-CM

## 2021-11-15 MED ORDER — AMPHETAMINE-DEXTROAMPHETAMINE 20 MG PO TABS: 20.0000 mg | ORAL_TABLET | Freq: Two times a day (BID) | ORAL | 0 refills | Status: DC

## 2021-11-15 NOTE — Telephone Encounter (Signed)
Needs adderall refilled .Marland KitchenPDMP reviewed during this encounter. No concerns.  Keep November appt for 6 month follow up.

## 2021-11-15 NOTE — Telephone Encounter (Signed)
Last OV: 08/08/21 Next OV: 02/08/22 Last RF: 10/05/21

## 2021-11-19 ENCOUNTER — Other Ambulatory Visit: Payer: Self-pay | Admitting: Physician Assistant

## 2021-11-19 DIAGNOSIS — E039 Hypothyroidism, unspecified: Secondary | ICD-10-CM

## 2022-02-08 ENCOUNTER — Encounter: Payer: Self-pay | Admitting: Physician Assistant

## 2022-02-08 ENCOUNTER — Ambulatory Visit (INDEPENDENT_AMBULATORY_CARE_PROVIDER_SITE_OTHER): Payer: Self-pay | Admitting: Physician Assistant

## 2022-02-08 VITALS — BP 121/84 | HR 100 | Ht 60.0 in | Wt 102.0 lb

## 2022-02-08 DIAGNOSIS — F988 Other specified behavioral and emotional disorders with onset usually occurring in childhood and adolescence: Secondary | ICD-10-CM

## 2022-02-08 DIAGNOSIS — Z13220 Encounter for screening for lipoid disorders: Secondary | ICD-10-CM

## 2022-02-08 DIAGNOSIS — E039 Hypothyroidism, unspecified: Secondary | ICD-10-CM

## 2022-02-08 DIAGNOSIS — F410 Panic disorder [episodic paroxysmal anxiety] without agoraphobia: Secondary | ICD-10-CM

## 2022-02-08 DIAGNOSIS — E559 Vitamin D deficiency, unspecified: Secondary | ICD-10-CM

## 2022-02-08 DIAGNOSIS — Z131 Encounter for screening for diabetes mellitus: Secondary | ICD-10-CM

## 2022-02-08 MED ORDER — CLONAZEPAM 0.5 MG PO TABS: ORAL_TABLET | ORAL | 2 refills | Status: DC

## 2022-02-08 MED ORDER — LISDEXAMFETAMINE DIMESYLATE 30 MG PO CAPS: 30.0000 mg | ORAL_CAPSULE | Freq: Every day | ORAL | 0 refills | Status: DC

## 2022-02-08 NOTE — Progress Notes (Signed)
Established Patient Office Visit  Subjective   Patient ID: Danielle [name], female    DOB: [birth year]  Age: 31 y.o. MRN: [id number]  Chief Complaint  Patient presents with   Follow-up    HPI Pt is a 31 yo female with ADD, MDD, Anxiety, hypothyroidism who presents to the clinic for medication refills.   Pt is doing well. Her mood is well controlled. She uses clonazepam as needed. She feels like adderall is not working like it once did. She would like to try generic vyvanse. She is self pay so would have to be generic.   No problems with thyroid. She is taking medication daily.   Patient Active Problem List   Diagnosis Date Noted   Nausea 12/10/2019   Attention deficit disorder (ADD) without hyperactivity 04/16/2019   Palpitations 12/28/2016   Vaginismus 09/25/2016   Irritable bowel syndrome with both constipation and diarrhea 07/20/2016   No energy 07/20/2016   Panic attack 05/15/2016   Hypothyroidism 11/11/2014   Vitamin D deficiency 11/11/2014   Frequent headaches 10/30/2014   Leg numbness 10/30/2014   Generalized anxiety disorder 10/30/2014   Dyspareunia 04/24/2012   Depression 01/04/2012   Past Medical History:  Diagnosis Date   Hypothyroidism    Family History  Problem Relation Age of Onset   Hypertension Mother        father    Diabetes Paternal Grandfather    Heart attack Paternal Grandfather    Breast cancer Paternal Grandmother    No Known Allergies    ROS See HPI.    Objective:     BP 121/84   Pulse 100   Ht 5' (1.524 m)   Wt 102 lb (46.3 kg)   SpO2 100%   BMI 19.92 kg/m  BP Readings from Last 3 Encounters:  02/08/22 121/84  08/08/21 (!) 145/84  05/10/21 118/61   Wt Readings from Last 3 Encounters:  02/08/22 102 lb (46.3 kg)  08/08/21 109 lb (49.4 kg)  05/10/21 113 lb (51.3 kg)    .Marland Kitchen    02/08/2022   11:04 AM 05/10/2021   10:33 AM 02/07/2021   11:26 AM 08/16/2020    3:19 PM 12/10/2019    8:50 AM  Depression screen PHQ 2/9  Decreased  Interest 0 0 0 0 1  Down, Depressed, Hopeless 0 0 0 0 1  PHQ - 2 Score 0 0 0 0 2  Altered sleeping 1  2 2 1   Tired, decreased energy 1  1 1 1   Change in appetite 0  1 0 2  Feeling bad or failure about yourself  0  0 0 0  Trouble concentrating 0  0 0 0  Moving slowly or fidgety/restless 0  0 0 0  Suicidal thoughts 0  0 0 0  PHQ-9 Score 2  4 3 6   Difficult doing work/chores Not difficult at all  Not difficult at all Somewhat difficult Somewhat difficult   .    02/08/2022   11:04 AM 02/07/2021   11:26 AM 08/16/2020    3:19 PM 12/10/2019    8:51 AM  GAD 7 : Generalized Anxiety Score  Nervous, Anxious, on Edge 1 2 1 2   Control/stop worrying 0 1 0 1  Worry too much - different things 0 1 1 1   Trouble relaxing 0 1 1 2   Restless 0 0 0 0  Easily annoyed or irritable 0 0 0 1  Afraid - awful might happen 0 0 0 0  Total GAD 7  Score 1 5 3 7   Anxiety Difficulty Not difficult at all Not difficult at all Somewhat difficult Somewhat difficult      Physical Exam Constitutional:      Appearance: Normal appearance.  Cardiovascular:     Rate and Rhythm: Normal rate and regular rhythm.     Pulses: Normal pulses.  Neurological:     Mental Status: She is alert.  Psychiatric:        Mood and Affect: Mood normal.        Assessment & Plan:  Marland KitchenHayley was seen today for follow-up.  Diagnoses and all orders for this visit:  Attention deficit disorder (ADD) without hyperactivity -     lisdexamfetamine (VYVANSE) 30 MG capsule; Take 1 capsule (30 mg total) by mouth daily.  Hypothyroidism, unspecified type -     TSH  Diabetes mellitus screening -     COMPLETE METABOLIC PANEL WITH GFR  Panic attack -     clonazePAM (KLONOPIN) 0.5 MG tablet; TAKE ONE TABLET BY MOUTH TWICE A DAY AS NEEDED FOR ANXIETY   Stop Adderall  Start Vyvanse 30mg  daily Let me know how did change goes PHQ and GAD numbers stable and controlled Ordered TSH/CMP Will adjust medications accordingly Clonazepam refilled  for as needed usage   Return in about 6 months (around 08/09/2022), or if symptoms worsen or fail to improve.    , PA-C

## 2022-02-10 MED ORDER — AMPHETAMINE-DEXTROAMPHETAMINE 20 MG PO TABS: 20.0000 mg | ORAL_TABLET | Freq: Two times a day (BID) | ORAL | 0 refills | Status: DC

## 2022-02-27 ENCOUNTER — Other Ambulatory Visit (HOSPITAL_COMMUNITY)
Admission: RE | Admit: 2022-02-27 | Discharge: 2022-02-27 | Disposition: A | Discharge status: Home / Self Care | Payer: Medicaid Other | Source: Ambulatory Visit | Attending: Obstetrics & Gynecology | Admitting: Obstetrics & Gynecology

## 2022-02-27 ENCOUNTER — Encounter: Payer: Self-pay | Admitting: Obstetrics & Gynecology

## 2022-02-27 ENCOUNTER — Ambulatory Visit (INDEPENDENT_AMBULATORY_CARE_PROVIDER_SITE_OTHER): Payer: Medicaid Other | Admitting: Obstetrics & Gynecology

## 2022-02-27 VITALS — BP 129/82 | HR 117 | Ht 60.0 in | Wt 99.0 lb

## 2022-02-27 DIAGNOSIS — Z3041 Encounter for surveillance of contraceptive pills: Secondary | ICD-10-CM

## 2022-02-27 DIAGNOSIS — Z113 Encounter for screening for infections with a predominantly sexual mode of transmission: Secondary | ICD-10-CM

## 2022-02-27 DIAGNOSIS — Z01419 Encounter for gynecological examination (general) (routine) without abnormal findings: Secondary | ICD-10-CM

## 2022-02-27 DIAGNOSIS — L509 Urticaria, unspecified: Secondary | ICD-10-CM | POA: Diagnosis not present

## 2022-02-27 DIAGNOSIS — Z01411 Encounter for gynecological examination (general) (routine) with abnormal findings: Secondary | ICD-10-CM | POA: Diagnosis not present

## 2022-02-27 NOTE — Progress Notes (Signed)
Subjective:     Danielle [name] is a 31 y.o. female here for a routine exam.  Current complaints: wants STD testing; would like to avoid speculum and pelvic exam due to pain.  Pt is not having any pain or bleeding issues.    Gynecologic History No LMP recorded (lmp unknown). Contraception: OCP (estrogen/progesterone)   Last Mammogram: n/a                         Last Pap Smear:  02/16/20- negative Last Colon Screening;  n/a Seat Belts:   yes Sun Screen:   yes Dental Check Up:  yes Brush & Floss:  yes       Obstetric History OB History  Gravida Para Term Preterm AB Living  0 0 0 0 0 0  SAB IAB Ectopic Multiple Live Births  0 0 0 0 0     The following portions of the patient's history were reviewed and updated as appropriate: allergies, current medications, past family history, past medical history, past social history, past surgical history, and problem list.  Review of Systems Pertinent items noted in HPI and remainder of comprehensive ROS otherwise negative.    Objective:     Vitals:   02/27/22 1328  BP: 129/82  Pulse: (!) 117  Weight: 99 lb (44.9 kg)  Height: 5' (1.524 m)   Vitals:  WNL General appearance: alert, cooperative and no distress  HEENT: Normocephalic, without obvious abnormality, atraumatic Eyes: negative Throat: lips, mucosa, and tongue normal; teeth and gums normal  Respiratory: Clear to auscultation bilaterally  CV: Regular rate and rhythm  Breasts:  Normal appearance, no masses or tenderness, no nipple retraction or dimpling  GI: Soft, non-tender; bowel sounds normal; no masses,  no organomegaly  GU: External Genitalia:  Tanner V, no lesion Urethra:  No prolapse   Vagina: Pt deferred  Cervix: Pt deferred  Uterus:  Pt deferred  Adnexa: Pt deferred  Musculoskeletal: No edema, redness or tenderness in the calves or thighs  Skin: Numerous excoriations in various levels of healing  Lymphatic: Axillary adenopathy: none     Psychiatric: Normal  mood and behavior        Assessment:    Healthy female exam.    Plan:   Blind swab for vaginal STD and vaginitis STD blood screening Bumps/itching  1% hydrocortsone cream & Eucerin cream; Mix 1 part hydrocortison cream with 3 parts eucerin and lather on skin.

## 2022-02-27 NOTE — Progress Notes (Signed)
Last Mammogram: n/a   Last Pap Smear:  02/16/20- negative Last Colon Screening;  n/a Seat Belts:   yes Sun Screen:   yes Dental Check Up:  yes Brush & Floss:  yes

## 2022-02-27 NOTE — Patient Instructions (Addendum)
[place] Medicaid Contact Center Phone: [phone] Monday-Friday 8 a.m. to 5 p.m. Closed on State holidays.   1% hydrocortsone cream Eucerin cream  Mix 1 part hydrocortison cream with 3 parts eucerin and lather on skin.

## 2022-02-28 LAB — CERVICOVAGINAL ANCILLARY ONLY
Bacterial Vaginitis (gardnerella): NEGATIVE
Candida Glabrata: NEGATIVE
Candida Vaginitis: NEGATIVE
Chlamydia: NEGATIVE
Comment: NEGATIVE
Comment: NEGATIVE
Comment: NEGATIVE
Comment: NEGATIVE
Comment: NEGATIVE
Comment: NORMAL
Neisseria Gonorrhea: NEGATIVE
Trichomonas: NEGATIVE

## 2022-02-28 LAB — HIV ANTIBODY (ROUTINE TESTING W REFLEX): HIV 1&2 Ab, 4th Generation: NONREACTIVE

## 2022-02-28 LAB — HEPATITIS C ANTIBODY: Hepatitis C Ab: NONREACTIVE

## 2022-02-28 LAB — HEPATITIS B SURFACE ANTIGEN: Hepatitis B Surface Ag: NONREACTIVE

## 2022-02-28 LAB — RPR: RPR Ser Ql: NONREACTIVE

## 2022-03-06 ENCOUNTER — Other Ambulatory Visit: Payer: Self-pay | Admitting: Obstetrics & Gynecology

## 2022-03-06 DIAGNOSIS — Z789 Other specified health status: Secondary | ICD-10-CM

## 2022-03-15 ENCOUNTER — Telehealth: Payer: Self-pay | Admitting: Neurology

## 2022-03-15 DIAGNOSIS — Z789 Other specified health status: Secondary | ICD-10-CM

## 2022-03-15 MED ORDER — NORETHIN ACE-ETH ESTRAD-FE 1-20 MG-MCG(24) PO TABS: 1.0000 | ORAL_TABLET | Freq: Every day | ORAL | 0 refills | Status: DC

## 2022-03-15 NOTE — Telephone Encounter (Signed)
Patient called and needs a one time fill on her birth control until her appt with GYN in January. She should have enough medication, but spoke with the pharmacy and they state she has no more refills. One month RX sent. Her GYN in out of town.   Lesly Rubenstein - FYI

## 2022-04-09 ENCOUNTER — Other Ambulatory Visit: Payer: Self-pay | Admitting: Physician Assistant

## 2022-04-09 DIAGNOSIS — Z789 Other specified health status: Secondary | ICD-10-CM

## 2022-04-18 ENCOUNTER — Other Ambulatory Visit: Payer: Self-pay | Admitting: Obstetrics & Gynecology

## 2022-04-18 ENCOUNTER — Other Ambulatory Visit: Payer: Self-pay

## 2022-04-18 DIAGNOSIS — Z789 Other specified health status: Secondary | ICD-10-CM

## 2022-04-18 MED ORDER — NORETHIN ACE-ETH ESTRAD-FE 1-20 MG-MCG(24) PO TABS: 1.0000 | ORAL_TABLET | Freq: Every day | ORAL | 10 refills | Status: DC

## 2022-04-18 NOTE — Progress Notes (Signed)
OCP refills sent for a year. Pt is update with annual.

## 2022-05-19 ENCOUNTER — Other Ambulatory Visit: Payer: Self-pay | Admitting: Neurology

## 2022-05-19 DIAGNOSIS — F988 Other specified behavioral and emotional disorders with onset usually occurring in childhood and adolescence: Secondary | ICD-10-CM

## 2022-05-19 MED ORDER — AMPHETAMINE-DEXTROAMPHETAMINE 20 MG PO TABS: 20.0000 mg | ORAL_TABLET | Freq: Two times a day (BID) | ORAL | 0 refills | Status: DC

## 2022-05-19 NOTE — Telephone Encounter (Signed)
Patient called for Adderall refills. Last seen in November and told to follow up in 6 months. Last written in January for 1 month supply. Pended the next 3 months - please sign.

## 2022-08-22 ENCOUNTER — Ambulatory Visit (INDEPENDENT_AMBULATORY_CARE_PROVIDER_SITE_OTHER): Payer: Self-pay | Admitting: Physician Assistant

## 2022-08-22 VITALS — BP 123/74 | HR 61 | Ht 60.0 in | Wt 102.0 lb

## 2022-08-22 DIAGNOSIS — F988 Other specified behavioral and emotional disorders with onset usually occurring in childhood and adolescence: Secondary | ICD-10-CM

## 2022-08-22 DIAGNOSIS — F411 Generalized anxiety disorder: Secondary | ICD-10-CM | POA: Insufficient documentation

## 2022-08-22 DIAGNOSIS — F410 Panic disorder [episodic paroxysmal anxiety] without agoraphobia: Secondary | ICD-10-CM

## 2022-08-22 DIAGNOSIS — R4589 Other symptoms and signs involving emotional state: Secondary | ICD-10-CM

## 2022-08-22 MED ORDER — AMPHETAMINE-DEXTROAMPHETAMINE 20 MG PO TABS: 20.0000 mg | ORAL_TABLET | Freq: Two times a day (BID) | ORAL | 0 refills | Status: DC

## 2022-08-22 MED ORDER — CLONAZEPAM 0.5 MG PO TABS: ORAL_TABLET | ORAL | 2 refills | Status: DC

## 2022-08-22 NOTE — Patient Instructions (Signed)
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
## 2022-08-23 ENCOUNTER — Encounter: Payer: Self-pay | Admitting: Physician Assistant

## 2022-11-23 ENCOUNTER — Other Ambulatory Visit: Payer: Self-pay | Admitting: Physician Assistant

## 2022-11-23 DIAGNOSIS — E039 Hypothyroidism, unspecified: Secondary | ICD-10-CM

## 2022-12-24 ENCOUNTER — Encounter: Payer: Self-pay | Admitting: Physician Assistant

## 2022-12-24 DIAGNOSIS — F988 Other specified behavioral and emotional disorders with onset usually occurring in childhood and adolescence: Secondary | ICD-10-CM

## 2022-12-25 MED ORDER — AMPHETAMINE-DEXTROAMPHETAMINE 20 MG PO TABS
20.0000 mg | ORAL_TABLET | Freq: Two times a day (BID) | ORAL | 0 refills | Status: DC
Start: 2023-01-23 — End: 2023-02-23

## 2022-12-25 MED ORDER — AMPHETAMINE-DEXTROAMPHETAMINE 20 MG PO TABS
20.0000 mg | ORAL_TABLET | Freq: Two times a day (BID) | ORAL | 0 refills | Status: DC
Start: 2023-02-22 — End: 2023-02-23

## 2022-12-25 MED ORDER — AMPHETAMINE-DEXTROAMPHETAMINE 20 MG PO TABS
20.0000 mg | ORAL_TABLET | Freq: Two times a day (BID) | ORAL | 0 refills | Status: DC
Start: 2022-12-25 — End: 2022-12-28

## 2022-12-28 ENCOUNTER — Telehealth: Payer: Self-pay | Admitting: Physician Assistant

## 2022-12-28 MED ORDER — AMPHETAMINE-DEXTROAMPHETAMINE 20 MG PO TABS
20.0000 mg | ORAL_TABLET | Freq: Two times a day (BID) | ORAL | 0 refills | Status: DC
Start: 2022-12-28 — End: 2023-02-23

## 2022-12-28 NOTE — Telephone Encounter (Signed)
Pt called.  Please send refill of Adderall to [facility] on [address], [place].  Current pharmacy does not have this medication.

## 2022-12-28 NOTE — Telephone Encounter (Signed)
Pended medication and new pharmacy information.

## 2022-12-28 NOTE — Addendum Note (Signed)
Addended by: Elizabeth Palau on: 12/28/2022 03:35 PM   Modules accepted: Orders

## 2023-02-13 ENCOUNTER — Telehealth: Payer: Self-pay | Admitting: *Deleted

## 2023-02-13 NOTE — Telephone Encounter (Signed)
Left patient a message to call and schedule annual. 

## 2023-02-17 ENCOUNTER — Other Ambulatory Visit: Payer: Self-pay | Admitting: Obstetrics & Gynecology

## 2023-02-17 DIAGNOSIS — Z789 Other specified health status: Secondary | ICD-10-CM

## 2023-02-20 ENCOUNTER — Other Ambulatory Visit: Payer: Self-pay

## 2023-02-20 DIAGNOSIS — Z789 Other specified health status: Secondary | ICD-10-CM

## 2023-02-20 MED ORDER — NORETHIN ACE-ETH ESTRAD-FE 1-20 MG-MCG(24) PO TABS: 1.0000 | ORAL_TABLET | Freq: Every day | ORAL | 0 refills | Status: DC

## 2023-02-20 NOTE — Progress Notes (Signed)
One refill of OCP sent until pt's annual appt

## 2023-02-23 ENCOUNTER — Ambulatory Visit: Payer: Medicaid Other

## 2023-02-23 ENCOUNTER — Ambulatory Visit: Payer: Medicaid Other | Admitting: Physician Assistant

## 2023-02-23 ENCOUNTER — Encounter: Payer: Self-pay | Admitting: Physician Assistant

## 2023-02-23 VITALS — BP 115/65 | HR 87 | Ht 60.0 in | Wt 105.0 lb

## 2023-02-23 DIAGNOSIS — E039 Hypothyroidism, unspecified: Secondary | ICD-10-CM

## 2023-02-23 DIAGNOSIS — Z79899 Other long term (current) drug therapy: Secondary | ICD-10-CM | POA: Diagnosis not present

## 2023-02-23 DIAGNOSIS — F988 Other specified behavioral and emotional disorders with onset usually occurring in childhood and adolescence: Secondary | ICD-10-CM

## 2023-02-23 DIAGNOSIS — F411 Generalized anxiety disorder: Secondary | ICD-10-CM | POA: Diagnosis not present

## 2023-02-23 DIAGNOSIS — F410 Panic disorder [episodic paroxysmal anxiety] without agoraphobia: Secondary | ICD-10-CM | POA: Diagnosis not present

## 2023-02-23 DIAGNOSIS — R4589 Other symptoms and signs involving emotional state: Secondary | ICD-10-CM

## 2023-02-23 DIAGNOSIS — M542 Cervicalgia: Secondary | ICD-10-CM | POA: Diagnosis not present

## 2023-02-23 DIAGNOSIS — R221 Localized swelling, mass and lump, neck: Secondary | ICD-10-CM | POA: Diagnosis not present

## 2023-02-23 DIAGNOSIS — R590 Localized enlarged lymph nodes: Secondary | ICD-10-CM

## 2023-02-23 MED ORDER — AMPHETAMINE-DEXTROAMPHETAMINE 20 MG PO TABS
20.0000 mg | ORAL_TABLET | Freq: Two times a day (BID) | ORAL | 0 refills | Status: DC
Start: 2023-03-25 — End: 2023-04-11

## 2023-02-23 MED ORDER — AMPHETAMINE-DEXTROAMPHETAMINE 20 MG PO TABS
20.0000 mg | ORAL_TABLET | Freq: Two times a day (BID) | ORAL | 0 refills | Status: DC
Start: 2023-04-24 — End: 2023-05-29

## 2023-02-23 MED ORDER — BUPROPION HCL ER (XL) 150 MG PO TB24
150.0000 mg | ORAL_TABLET | Freq: Every day | ORAL | 1 refills | Status: DC
Start: 2023-02-23 — End: 2023-04-11

## 2023-02-23 MED ORDER — AMPHETAMINE-DEXTROAMPHETAMINE 20 MG PO TABS
20.0000 mg | ORAL_TABLET | Freq: Two times a day (BID) | ORAL | 0 refills | Status: DC
Start: 2023-02-23 — End: 2023-05-29
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
## 2023-02-23 NOTE — Patient Instructions (Addendum)
Start zyrtec at bedtime for allergies.  Start wellbutrin.

## 2023-02-24 LAB — TSH+FREE T4
Free T4: 1.4 ng/dL (ref 0.82–1.77)
TSH: 4.73 u[IU]/mL — ABNORMAL HIGH (ref 0.450–4.500)

## 2023-02-24 LAB — CMP14+EGFR
ALT: 48 [IU]/L — ABNORMAL HIGH (ref 0–32)
AST: 27 [IU]/L (ref 0–40)
Albumin: 3.9 g/dL (ref 3.9–4.9)
Alkaline Phosphatase: 53 [IU]/L (ref 44–121)
BUN/Creatinine Ratio: 15 (ref 9–23)
BUN: 11 mg/dL (ref 6–20)
Bilirubin Total: 0.7 mg/dL (ref 0.0–1.2)
CO2: 26 mmol/L (ref 20–29)
Calcium: 8.9 mg/dL (ref 8.7–10.2)
Chloride: 104 mmol/L (ref 96–106)
Creatinine, Ser: 0.72 mg/dL (ref 0.57–1.00)
Globulin, Total: 2.2 g/dL (ref 1.5–4.5)
Glucose: 84 mg/dL (ref 70–99)
Potassium: 4.1 mmol/L (ref 3.5–5.2)
Sodium: 143 mmol/L (ref 134–144)
Total Protein: 6.1 g/dL (ref 6.0–8.5)
eGFR: 114 mL/min/{1.73_m2} (ref >59)

## 2023-02-24 LAB — CBC WITH DIFFERENTIAL/PLATELET
Basophils Absolute: 0.1 10*3/uL (ref 0.0–0.2)
Basos: 1 %
EOS (ABSOLUTE): 0.2 10*3/uL (ref 0.0–0.4)
Eos: 3 %
Hematocrit: 39.6 % (ref 34.0–46.6)
Hemoglobin: 12.6 g/dL (ref 11.1–15.9)
Immature Grans (Abs): 0 10*3/uL (ref 0.0–0.1)
Immature Granulocytes: 0 %
Lymphocytes Absolute: 1.9 10*3/uL (ref 0.7–3.1)
Lymphs: 28 %
MCH: 30 pg (ref 26.6–33.0)
MCHC: 31.8 g/dL (ref 31.5–35.7)
MCV: 94 fL (ref 79–97)
Monocytes Absolute: 0.4 10*3/uL (ref 0.1–0.9)
Monocytes: 6 %
Neutrophils Absolute: 4.2 10*3/uL (ref 1.4–7.0)
Neutrophils: 62 %
Platelets: 217 10*3/uL (ref 150–450)
RBC: 4.2 x10E6/uL (ref 3.77–5.28)
RDW: 11.7 % (ref 11.7–15.4)
WBC: 6.7 10*3/uL (ref 3.4–10.8)

## 2023-02-26 ENCOUNTER — Encounter: Payer: Self-pay | Admitting: Physician Assistant

## 2023-02-26 NOTE — Progress Notes (Signed)
Danielle [name],   One liver enzyme is up some. Avoid alcohol and tylenol products and suggest recheck in 6 weeks.  Your TSH is a little elevated but free T4 looks to be in normal range. Stay on same dose of medication for now and recheck lab only in 6 weeks.

## 2023-02-28 ENCOUNTER — Telehealth: Payer: Self-pay

## 2023-02-28 NOTE — Telephone Encounter (Signed)
Copied from CRM [id number]. Topic: Appointments - Appointment Cancel/Reschedule >> Feb 28, 2023  1:54 PM Fonda Kinder J wrote: Patient/patient representative is calling to reschedule 09/10/22 appointment. Patient prefers 4-6 weeks out. Appointment made outside of [facility]

## 2023-03-05 NOTE — Progress Notes (Signed)
 No abnormality found on ultrasound

## 2023-03-12 ENCOUNTER — Ambulatory Visit: Payer: Medicaid Other | Admitting: Obstetrics & Gynecology

## 2023-03-12 ENCOUNTER — Encounter: Payer: Self-pay | Admitting: Obstetrics & Gynecology

## 2023-03-12 ENCOUNTER — Other Ambulatory Visit (HOSPITAL_COMMUNITY)
Admission: RE | Admit: 2023-03-12 | Discharge: 2023-03-12 | Disposition: A | Discharge status: Home / Self Care | Payer: Medicaid Other | Source: Ambulatory Visit | Attending: Obstetrics & Gynecology | Admitting: Obstetrics & Gynecology

## 2023-03-12 VITALS — BP 128/81 | HR 105 | Ht 60.0 in | Wt 99.0 lb

## 2023-03-12 DIAGNOSIS — Z1151 Encounter for screening for human papillomavirus (HPV): Secondary | ICD-10-CM

## 2023-03-12 DIAGNOSIS — Z01419 Encounter for gynecological examination (general) (routine) without abnormal findings: Secondary | ICD-10-CM | POA: Diagnosis not present

## 2023-03-12 DIAGNOSIS — Z113 Encounter for screening for infections with a predominantly sexual mode of transmission: Secondary | ICD-10-CM | POA: Insufficient documentation

## 2023-03-12 DIAGNOSIS — Z789 Other specified health status: Secondary | ICD-10-CM

## 2023-03-12 MED ORDER — NORETHIN ACE-ETH ESTRAD-FE 1-20 MG-MCG(24) PO TABS: 1.0000 | ORAL_TABLET | Freq: Every day | ORAL | 0 refills | Status: DC

## 2023-03-12 NOTE — Progress Notes (Signed)
   Subjective:     Danielle [name] is a 32 y.o. female here for a routine exam.  Current complaints: wants sterilization, STD check.   Gynecologic History No LMP recorded. (Menstrual status: Oral contraceptives). Contraception: OCP (estrogen/progesterone) Last Mammogram: Never Last Pap Smear:  11/21 Last Colon Screening;  Never Seat Belts:   yes Sun Screen:   yes Dental Check Up:  yes Brush & Floss:  yes   Obstetric History OB History  Gravida Para Term Preterm AB Living  0 0 0 0 0 0  SAB IAB Ectopic Multiple Live Births  0 0 0 0 0     The following portions of the patient's history were reviewed and updated as appropriate: allergies, current medications, past family history, past medical history, past social history, past surgical history, and problem list.  Review of Systems Pertinent items noted in HPI and remainder of comprehensive ROS otherwise negative.    Objective:     Vitals:   03/12/23 1547  BP: 128/81  Pulse: (!) 105  Weight: 99 lb (44.9 kg)  Height: 5' (1.524 m)   Vitals:  WNL General appearance: alert, cooperative and no distress  HEENT: Normocephalic, without obvious abnormality, atraumatic Eyes: negative Throat: lips, mucosa, and tongue normal; teeth and gums normal  Respiratory: Clear to auscultation bilaterally  CV: Regular rate and rhythm  Breasts:  Normal appearance, no masses or tenderness, no nipple retraction or dimpling  GI: Soft, non-tender; bowel sounds normal; no masses,  no organomegaly  GU: External Genitalia:  Tanner V, no lesion Urethra:  No prolapse   Vagina: Pink, normal rugae, no blood or discharge  Cervix: No CMT, no lesion  Uterus:  Normal size and contour, non tender  Adnexa: Normal, no masses, non tender  Musculoskeletal: No edema, redness or tenderness in the calves or thighs  Skin: No lesions or rash  Lymphatic: Axillary adenopathy: none     Psychiatric: Normal mood and behavior        Assessment:    Healthy  female exam.    Plan:   Pap smear STD testing--blood and vaginal Referral for BTL consult. Pelvic PT suggested--pt getting drivers license back soon; will consider.  OCP refill given while planning BTL (30-day papers signed today).

## 2023-03-13 LAB — HIV ANTIBODY (ROUTINE TESTING W REFLEX): HIV Screen 4th Generation wRfx: NONREACTIVE

## 2023-03-13 LAB — HEPATITIS B SURFACE ANTIGEN: Hepatitis B Surface Ag: NEGATIVE

## 2023-03-13 LAB — HEPATITIS C ANTIBODY: Hep C Virus Ab: NONREACTIVE

## 2023-03-13 LAB — RPR: RPR Ser Ql: NONREACTIVE

## 2023-03-15 LAB — CYTOLOGY - PAP
Chlamydia: NEGATIVE
Comment: NEGATIVE
Comment: NEGATIVE
Comment: NEGATIVE
Comment: NORMAL
Diagnosis: UNDETERMINED — AB
High risk HPV: POSITIVE — AB
Neisseria Gonorrhea: NEGATIVE
Trichomonas: NEGATIVE

## 2023-03-17 ENCOUNTER — Other Ambulatory Visit: Payer: Self-pay | Admitting: Obstetrics & Gynecology

## 2023-03-17 DIAGNOSIS — Z789 Other specified health status: Secondary | ICD-10-CM

## 2023-03-19 ENCOUNTER — Encounter: Payer: Self-pay | Admitting: Obstetrics & Gynecology

## 2023-03-19 ENCOUNTER — Telehealth: Payer: Self-pay | Admitting: *Deleted

## 2023-03-19 DIAGNOSIS — N871 Moderate cervical dysplasia: Secondary | ICD-10-CM | POA: Insufficient documentation

## 2023-03-19 DIAGNOSIS — R87610 Atypical squamous cells of undetermined significance on cytologic smear of cervix (ASC-US): Secondary | ICD-10-CM | POA: Insufficient documentation

## 2023-03-19 NOTE — Telephone Encounter (Signed)
Left patient a message to call and schedule Colpo with Dr. Penne Lash.

## 2023-03-23 ENCOUNTER — Other Ambulatory Visit: Payer: Self-pay | Admitting: Emergency Medicine

## 2023-03-23 DIAGNOSIS — F410 Panic disorder [episodic paroxysmal anxiety] without agoraphobia: Secondary | ICD-10-CM

## 2023-03-23 NOTE — Telephone Encounter (Signed)
Patient requesting refill ( clonazepam ) Last written 08/22/2022 last office visit 02/23/2023 Upcoming appointment 04/02/2023

## 2023-03-26 MED ORDER — CLONAZEPAM 0.5 MG PO TABS: ORAL_TABLET | ORAL | 2 refills | Status: DC

## 2023-03-29 ENCOUNTER — Ambulatory Visit: Payer: Medicaid Other | Admitting: Obstetrics and Gynecology

## 2023-03-29 ENCOUNTER — Encounter: Payer: Self-pay | Admitting: Obstetrics and Gynecology

## 2023-03-29 ENCOUNTER — Encounter: Payer: Self-pay | Admitting: Obstetrics & Gynecology

## 2023-03-29 VITALS — BP 127/84 | HR 121 | Ht 60.0 in

## 2023-03-29 DIAGNOSIS — R87810 Cervical high risk human papillomavirus (HPV) DNA test positive: Secondary | ICD-10-CM | POA: Diagnosis not present

## 2023-03-29 DIAGNOSIS — Z3009 Encounter for other general counseling and advice on contraception: Secondary | ICD-10-CM

## 2023-03-29 DIAGNOSIS — R87610 Atypical squamous cells of undetermined significance on cytologic smear of cervix (ASC-US): Secondary | ICD-10-CM | POA: Diagnosis not present

## 2023-03-29 NOTE — Patient Instructions (Signed)
VISIT SUMMARY:  During today's visit, we discussed your planned tubal ligation procedure, reviewed your recent abnormal Pap smear result, and addressed your anxiety related to these medical issues. You expressed certainty about your decision for permanent contraception and we talked about the next steps for both the tubal ligation and the follow-up for your Pap smear. We also discussed strategies to manage your anxiety during these procedures.  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 We will schedule a colposcopy to closely examine your cervix and possibly take a biopsy. This will help [place] understand the extent of the changes and decide on the best course of action.  -PERMANENT CONTRACEPTION: You have decided to proceed with a bilateral salpingectomy (removal of fallopian tubes) for permanent contraception/sterilization, and we discussed the procedure, including its risks and benefits.   INSTRUCTIONS:  Please schedule your colposcopy as soon as possible. We will also arrange the date for your bilateral salpingectomy. Remember to avoid vaping and alcohol for a month before and after your surgery. If you have any questions or need further support, do not hesitate to reach out.

## 2023-03-29 NOTE — Progress Notes (Signed)
   RETURN GYNECOLOGY VISIT  Subjective:  Danielle [name] is a 32 y.o. G0P0000 with LMP 03/21/23 presenting for sterilization consult   She expresses certainty about her decision to not have children in the future, rating her certainty as a 10 on a scale of 1 to 10. The patient has been considering this decision for a couple of years and feels that it will provide her with reassurance and protection. She has previously used birth control pills, but has experienced mood effects with some formulations. She is currently using a low estrogen pill Danielle [name]) which she reports tolerating well. She is not interested in LARC.   Of note, recent pap ASCUS/HPV+. Pt needs to schedule colpo.  PMH: anxiety, hypothyroidism, ADD PSH: EGD, ear tubes Meds: COCs, wellbutrin, adderall, synthroid, klonopin prn OB: G0 Gyn: Pap ASCUS/HPV+, needs colpo Soc: +THC vape, occ alcohol. Denies tobacco, cocaine, methamphetamines, opiates.  Objective:   Vitals:   03/29/23 1555  BP: 127/84  Pulse: (!) 121  Height: 5' (1.524 m)   General:  Alert, oriented and cooperative. Patient is in no acute distress.  Skin: Skin is warm and dry. No rash noted.   Cardiovascular: Normal heart rate noted  Respiratory: Normal respiratory effort, no problems with respiration noted   Assessment and Plan:  Danielle [name] is a 32 y.o. presenting for sterilization consult  Sterilization consult - She desires permanent sterilization. Discussed alternatives including LARC options and vasectomy. Reviewed that these options are lower risk, equally efficacious, and have the benefit of being reversible. She declines these options.  - Discussed surgery of salpingectomy vs tubal ligation. Reviewed salpingectomy has lower risk of contraceptive failure and potential benefit of ovarian cancer risk reduction. She is agreeable to bilateral salpingectomy - Risks of surgery include but are not limited to: bleeding, infection, injury to surrounding  organs/tissues (i.e. bowel/bladder/ureters), need for additional procedures, wound complications, hospital re-admission, and conversion to open surgery, VTE. We reviewed risk of contraceptive failure and risk of regret.  - Reviewed restrictions and recovery following surgery - After discussion, Danielle [name] would like to move forward with laparoscopic bilateral salpingectomy. Referral placed.  -     Ambulatory Referral For Surgery Scheduling  ASCUS with positive high risk HPV cervical Discussed need for colposcopy. Reviewed general procedure and what to expect. Reviewed role of HPV Colpo scheduled 2/10 with Danielle [name]  Total encounter time: 34 minutes  Future Appointments  Date Time Provider Department Center  04/09/2023  3:40 PM Danielle Longs, Danielle [name] [facility] None  05/14/2023 10:10 AM Danielle Dukes, Danielle [name] [facility]   Lennart Pall, Danielle [name]

## 2023-04-02 ENCOUNTER — Ambulatory Visit: Payer: Medicaid Other | Admitting: Physician Assistant

## 2023-04-08 ENCOUNTER — Other Ambulatory Visit: Payer: Self-pay | Admitting: Obstetrics & Gynecology

## 2023-04-08 DIAGNOSIS — Z789 Other specified health status: Secondary | ICD-10-CM

## 2023-04-09 ENCOUNTER — Ambulatory Visit: Payer: Medicaid Other | Admitting: Physician Assistant

## 2023-04-10 ENCOUNTER — Ambulatory Visit: Payer: Medicaid Other | Admitting: Physician Assistant

## 2023-04-11 ENCOUNTER — Ambulatory Visit: Payer: Medicaid Other | Admitting: Physician Assistant

## 2023-04-11 ENCOUNTER — Encounter: Payer: Self-pay | Admitting: Physician Assistant

## 2023-04-11 VITALS — BP 137/87 | HR 117 | Ht 60.0 in | Wt 95.0 lb

## 2023-04-11 DIAGNOSIS — E039 Hypothyroidism, unspecified: Secondary | ICD-10-CM

## 2023-04-11 DIAGNOSIS — R7401 Elevation of levels of liver transaminase levels: Secondary | ICD-10-CM

## 2023-04-11 DIAGNOSIS — F411 Generalized anxiety disorder: Secondary | ICD-10-CM | POA: Diagnosis not present

## 2023-04-11 DIAGNOSIS — F988 Other specified behavioral and emotional disorders with onset usually occurring in childhood and adolescence: Secondary | ICD-10-CM | POA: Diagnosis not present

## 2023-04-11 DIAGNOSIS — R4589 Other symptoms and signs involving emotional state: Secondary | ICD-10-CM

## 2023-04-11 MED ORDER — AMPHETAMINE-DEXTROAMPHETAMINE 20 MG PO TABS: 20.0000 mg | ORAL_TABLET | Freq: Two times a day (BID) | ORAL | 0 refills | Status: DC

## 2023-04-11 MED ORDER — BUPROPION HCL ER (XL) 300 MG PO TB24: 300.0000 mg | ORAL_TABLET | Freq: Every day | ORAL | 0 refills | Status: DC
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
## 2023-04-12 LAB — CMP14+EGFR
ALT: 12 [IU]/L (ref 0–32)
AST: 16 [IU]/L (ref 0–40)
Albumin: 4.4 g/dL (ref 3.9–4.9)
Alkaline Phosphatase: 48 [IU]/L (ref 44–121)
BUN/Creatinine Ratio: 12 (ref 9–23)
BUN: 9 mg/dL (ref 6–20)
Bilirubin Total: 1.2 mg/dL (ref 0.0–1.2)
CO2: 24 mmol/L (ref 20–29)
Calcium: 9.1 mg/dL (ref 8.7–10.2)
Chloride: 103 mmol/L (ref 96–106)
Creatinine, Ser: 0.76 mg/dL (ref 0.57–1.00)
Globulin, Total: 2.3 g/dL (ref 1.5–4.5)
Glucose: 105 mg/dL — ABNORMAL HIGH (ref 70–99)
Potassium: 3.6 mmol/L (ref 3.5–5.2)
Sodium: 141 mmol/L (ref 134–144)
Total Protein: 6.7 g/dL (ref 6.0–8.5)
eGFR: 107 mL/min/{1.73_m2} (ref >59)

## 2023-04-12 LAB — TSH+FREE T4
Free T4: 1.66 ng/dL (ref 0.82–1.77)
TSH: 3.05 u[IU]/mL (ref 0.450–4.500)

## 2023-04-13 NOTE — Progress Notes (Signed)
 Liver enzyme improved. Thyroid looks good.

## 2023-04-30 ENCOUNTER — Other Ambulatory Visit: Payer: Self-pay | Admitting: Physician Assistant

## 2023-04-30 DIAGNOSIS — R4589 Other symptoms and signs involving emotional state: Secondary | ICD-10-CM

## 2023-05-02 ENCOUNTER — Encounter: Payer: Self-pay | Admitting: Obstetrics & Gynecology

## 2023-05-03 ENCOUNTER — Telehealth: Payer: Self-pay

## 2023-05-03 NOTE — Telephone Encounter (Signed)
Patient reached out to the doctor's office to schedule surgery. I Called patient to see if she was available for surgery w/ Dr. Berton Lan on 06/05/23. Left voicemail asking for a return call asap at [phone].

## 2023-05-03 NOTE — Telephone Encounter (Signed)
Patient called me back and would like to be scheduled for surgery w/ Dr. Berton Lan on 06/05/23 at 3:15 pm. Patient agreed to arrive at [facility] at 1:00 pm. Pre-op and surgery  instructions were provided by phone. Written details will be sent to patients Mychart.

## 2023-05-07 ENCOUNTER — Ambulatory Visit (HOSPITAL_BASED_OUTPATIENT_CLINIC_OR_DEPARTMENT_OTHER): Admit: 2023-05-07 | Payer: Medicaid Other | Admitting: Obstetrics and Gynecology

## 2023-05-07 ENCOUNTER — Encounter (HOSPITAL_BASED_OUTPATIENT_CLINIC_OR_DEPARTMENT_OTHER): Payer: Self-pay

## 2023-05-07 SURGERY — LAPAROSCOPIC BILATERAL SALPINGECTOMY
Anesthesia: Choice | Laterality: Bilateral

## 2023-05-08 ENCOUNTER — Other Ambulatory Visit: Payer: Self-pay | Admitting: Obstetrics and Gynecology

## 2023-05-08 DIAGNOSIS — Z3009 Encounter for other general counseling and advice on contraception: Secondary | ICD-10-CM

## 2023-05-14 ENCOUNTER — Encounter: Payer: Self-pay | Admitting: Obstetrics & Gynecology

## 2023-05-14 ENCOUNTER — Ambulatory Visit: Payer: Medicaid Other | Admitting: Obstetrics & Gynecology

## 2023-05-14 ENCOUNTER — Other Ambulatory Visit (HOSPITAL_COMMUNITY)
Admission: RE | Admit: 2023-05-14 | Discharge: 2023-05-14 | Disposition: A | Discharge status: Home / Self Care | Payer: Medicaid Other | Source: Ambulatory Visit | Attending: Obstetrics & Gynecology | Admitting: Obstetrics & Gynecology

## 2023-05-14 VITALS — BP 123/88 | HR 108 | Ht 60.0 in | Wt 97.0 lb

## 2023-05-14 DIAGNOSIS — R87610 Atypical squamous cells of undetermined significance on cytologic smear of cervix (ASC-US): Secondary | ICD-10-CM | POA: Diagnosis not present

## 2023-05-14 DIAGNOSIS — Z3202 Encounter for pregnancy test, result negative: Secondary | ICD-10-CM

## 2023-05-14 DIAGNOSIS — R87810 Cervical high risk human papillomavirus (HPV) DNA test positive: Secondary | ICD-10-CM

## 2023-05-14 DIAGNOSIS — Z01812 Encounter for preprocedural laboratory examination: Secondary | ICD-10-CM

## 2023-05-14 DIAGNOSIS — N871 Moderate cervical dysplasia: Secondary | ICD-10-CM | POA: Diagnosis not present

## 2023-05-14 LAB — POCT URINE PREGNANCY: Preg Test, Ur: NEGATIVE

## 2023-05-14 NOTE — Progress Notes (Signed)
 Colposcopy Procedure Note  Indications: Pap smear 1 months ago showed: ASCUS with POSITIVE high risk HPV. The prior pap showed no abnormalities.  Prior cervical/vaginal disease: normal exam without visible pathology. Prior cervical treatment: no treatment.  Procedure Details  The risks and benefits of the procedure and Written informed consent obtained.  Speculum placed in vagina and excellent visualization of cervix achieved, cervix swabbed x 3 with acetic acid  solution.  Findings: Cervix: acetowhite lesion(s) noted at 10 & 12 and 5 with most sever at 10 and 12 o'clock; cervix swabbed with Lugol's solution, SCJ visualized, Benzocaine 20% spray applied to cervix, endocervical curettage performed, specimen labelled and sent to pathology, and hemostasis achieved with Monsel's solution. Vaginal inspection: vaginal colposcopy not performed. Vulvar colposcopy: vulvar colposcopy not performed.  Specimens: Biopsy at 10 and 12, ECC  Complications: none.  Plan: Specimens labelled and sent to Pathology. Will base further treatment on Pathology findings.

## 2023-05-17 LAB — SURGICAL PATHOLOGY

## 2023-05-21 ENCOUNTER — Encounter: Payer: Self-pay | Admitting: Obstetrics & Gynecology

## 2023-05-25 ENCOUNTER — Telehealth: Payer: Self-pay | Admitting: *Deleted

## 2023-05-25 NOTE — Telephone Encounter (Signed)
Left patient a message to call and schedule. 

## 2023-05-25 NOTE — Telephone Encounter (Signed)
-----   Message from Elsie Lincoln sent at 05/21/2023  8:31 AM EST ----- Needs LEEP--probably in the OR.  Need pre op visit either virtual or in person, whichever the patient chooses.

## 2023-05-28 ENCOUNTER — Telehealth: Payer: Medicaid Other | Admitting: Obstetrics & Gynecology

## 2023-05-28 ENCOUNTER — Encounter: Payer: Self-pay | Admitting: Obstetrics & Gynecology

## 2023-05-28 DIAGNOSIS — N871 Moderate cervical dysplasia: Secondary | ICD-10-CM | POA: Diagnosis not present

## 2023-05-28 NOTE — Progress Notes (Signed)
    GYNECOLOGY VIRTUAL VISIT ENCOUNTER NOTE  Provider location: Center for [facility] [place]   Patient location: Home  I connected with Danielle [name] on 05/28/23 at  2:30 PM EST by MyChart Video Encounter and verified that I am speaking with the correct person using two identifiers.   I discussed the limitations, risks, security and privacy concerns of performing an evaluation and management service virtually and the availability of in person appointments. I also discussed with the patient that there may be a patient responsible charge related to this service. The patient expressed understanding and agreed to proceed.   History:  Danielle [name] is a 33 y.o. G0P0000 female to discuss her colposcopy biopsy result in the LEEP procedure.     Past Medical History:  Diagnosis Date   ADD (attention deficit disorder)    Anxiety    Hypothyroidism    Vaginal Pap smear, abnormal    Past Surgical History:  Procedure Laterality Date   UPPER GI ENDOSCOPY     WISDOM TOOTH EXTRACTION     The following portions of the patient's history were reviewed and updated as appropriate: allergies, current medications, past family history, past medical history, past social history, past surgical history and problem list.    Review of Systems:  Pertinent items noted in HPI and remainder of comprehensive ROS otherwise negative.  Physical Exam:   General:  Alert, oriented and cooperative. Patient appears to be in no acute distress.  Mental Status: Normal mood and affect. Normal behavior. Normal judgment and thought content.   Respiratory: Normal respiratory effort, no problems with respiration noted  Rest of physical exam deferred due to type of encounter  Labs and Imaging No results found for this or any previous visit (from the past 2 weeks). No results found.     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  Patient is scheduled for a BTL on March 4.  She would like to have the LEEP at the same time.  We are going to do the LEEP in the OR regardless given her anxiety and needing to stay still for an extended period of time.  A note was sent to Dr. Roma Kayser and our surgery scheduler.  Patient understands the risk of the procedure which include but not limited to bleeding infection damage to surrounding structures.  Patient is getting a BTL so future childbearing is not a concern.   I discussed the assessment and treatment plan with the patient. The patient was provided an opportunity to ask questions and all were answered. The patient agreed with the plan and demonstrated an understanding of the instructions.   The patient was advised to call back or seek an in-person evaluation/go to the ED if the symptoms worsen or if the condition fails to improve as anticipated.  I provided 10 minutes of face-to-face time during this encounter. I also spent 16 minutes dedicated to the care of this patient including pre-visit review of records, post visit ordering of medications and appropriate tests or procedures, coordinating care and documenting this visit encounter.    Elsie Lincoln, MD Center for [employer], [facility]

## 2023-05-29 ENCOUNTER — Encounter (HOSPITAL_COMMUNITY): Payer: Self-pay | Admitting: Obstetrics and Gynecology

## 2023-05-29 NOTE — Progress Notes (Signed)
 Spoke w/ via phone for pre-op interview--- Danielle [name] needs dos---- CBC, T&S and UPT per surgeon.        [name] results------ COVID test -----patient states asymptomatic no test needed Arrive at -------1330 NPO after MN NO Solid Food.  Clear liquids from MN until---1230 Pre-Surgery Ensure or G2:  Med rec completed Medications to take morning of surgery ----- Wellbutrin, Klonopin PRN, Loestrin and Synthroid Diabetic medication -----  GLP1 agonist last dose: GLP1 instructions:  Patient instructed no nail polish to be worn day of surgery Patient instructed to bring photo id and insurance card day of surgery Patient aware to have Driver (ride ) / caregiver    for 24 hours after surgery - Mother or father Danielle [name] or Danielle [name] Patient Special Instructions -----shower with antibacterial soap. Pre-Op special Instructions -----  Patient verbalized understanding of instructions that were given at this phone interview. Patient denies chest pain, sob, fever, cough at the interview.

## 2023-05-31 ENCOUNTER — Encounter: Payer: Self-pay | Admitting: Obstetrics and Gynecology

## 2023-05-31 NOTE — Telephone Encounter (Signed)
 Called patient to discuss message below. She agrees that she would rather do the procedures at the same time and is agreeable to rescheduling to 4/1 or 4/29. Scheduling team notified.   Harvie Bridge, MD Obstetrician & Gynecologist, [facility] for [employer], [facility]

## 2023-06-05 ENCOUNTER — Encounter (HOSPITAL_COMMUNITY)
Admission: RE | Disposition: A | Discharge status: Home / Self Care | Payer: Self-pay | Source: Home / Self Care | Attending: Obstetrics and Gynecology

## 2023-06-05 ENCOUNTER — Ambulatory Visit (HOSPITAL_COMMUNITY): Admitting: Anesthesiology

## 2023-06-05 ENCOUNTER — Ambulatory Visit (HOSPITAL_COMMUNITY)
Admission: RE | Admit: 2023-06-05 | Discharge: 2023-06-05 | Disposition: A | Discharge status: Home / Self Care | Payer: Medicaid Other | Attending: Obstetrics and Gynecology | Admitting: Obstetrics and Gynecology

## 2023-06-05 ENCOUNTER — Ambulatory Visit (HOSPITAL_BASED_OUTPATIENT_CLINIC_OR_DEPARTMENT_OTHER): Admitting: Anesthesiology

## 2023-06-05 ENCOUNTER — Encounter (HOSPITAL_COMMUNITY): Payer: Self-pay | Admitting: Obstetrics and Gynecology

## 2023-06-05 ENCOUNTER — Other Ambulatory Visit: Payer: Self-pay

## 2023-06-05 DIAGNOSIS — D252 Subserosal leiomyoma of uterus: Secondary | ICD-10-CM | POA: Diagnosis not present

## 2023-06-05 DIAGNOSIS — Z640 Problems related to unwanted pregnancy: Secondary | ICD-10-CM | POA: Diagnosis not present

## 2023-06-05 DIAGNOSIS — E039 Hypothyroidism, unspecified: Secondary | ICD-10-CM | POA: Diagnosis not present

## 2023-06-05 DIAGNOSIS — Z302 Encounter for sterilization: Secondary | ICD-10-CM

## 2023-06-05 DIAGNOSIS — F418 Other specified anxiety disorders: Secondary | ICD-10-CM

## 2023-06-05 DIAGNOSIS — N871 Moderate cervical dysplasia: Secondary | ICD-10-CM

## 2023-06-05 DIAGNOSIS — F988 Other specified behavioral and emotional disorders with onset usually occurring in childhood and adolescence: Secondary | ICD-10-CM | POA: Diagnosis not present

## 2023-06-05 DIAGNOSIS — Z3009 Encounter for other general counseling and advice on contraception: Secondary | ICD-10-CM

## 2023-06-05 DIAGNOSIS — F419 Anxiety disorder, unspecified: Secondary | ICD-10-CM | POA: Insufficient documentation

## 2023-06-05 HISTORY — PX: LAPAROSCOPIC BILATERAL SALPINGECTOMY: SHX5889

## 2023-06-05 HISTORY — PX: LEEP: SHX91

## 2023-06-05 LAB — CBC
HCT: 42.2 % (ref 36.0–46.0)
Hemoglobin: 13.8 g/dL (ref 12.0–15.0)
MCH: 30 pg (ref 26.0–34.0)
MCHC: 32.7 g/dL (ref 30.0–36.0)
MCV: 91.7 fL (ref 80.0–100.0)
Platelets: 220 10*3/uL (ref 150–400)
RBC: 4.6 MIL/uL (ref 3.87–5.11)
RDW: 12.8 % (ref 11.5–15.5)
WBC: 5.6 10*3/uL (ref 4.0–10.5)
nRBC: 0 % (ref 0.0–0.2)

## 2023-06-05 LAB — ABO/RH: ABO/RH(D): AB POS

## 2023-06-05 LAB — POCT PREGNANCY, URINE: Preg Test, Ur: NEGATIVE

## 2023-06-05 LAB — TYPE AND SCREEN
ABO/RH(D): AB POS
Antibody Screen: NEGATIVE

## 2023-06-05 SURGERY — SALPINGECTOMY, BILATERAL, LAPAROSCOPIC
Anesthesia: General | Site: Pelvis

## 2023-06-05 MED ORDER — FENTANYL CITRATE (PF) 100 MCG/2ML IJ SOLN
25.0000 ug | INTRAMUSCULAR | Status: DC | PRN
  Administered 2023-06-05: 50 ug via INTRAVENOUS

## 2023-06-05 MED ORDER — MONSELS FERRIC SUBSULFATE EX SOLN
CUTANEOUS | Status: AC
  Filled 2023-06-05: qty 8

## 2023-06-05 MED ORDER — BUPIVACAINE HCL (PF) 0.25 % IJ SOLN
INTRAMUSCULAR | Status: AC
  Filled 2023-06-05: qty 30

## 2023-06-05 MED ORDER — PROPOFOL 10 MG/ML IV BOLUS
INTRAVENOUS | Status: DC | PRN
  Administered 2023-06-05: 200 mg via INTRAVENOUS

## 2023-06-05 MED ORDER — FENTANYL CITRATE (PF) 250 MCG/5ML IJ SOLN
INTRAMUSCULAR | Status: AC
  Filled 2023-06-05: qty 5

## 2023-06-05 MED ORDER — FENTANYL CITRATE (PF) 100 MCG/2ML IJ SOLN
INTRAMUSCULAR | Status: AC
  Filled 2023-06-05: qty 2

## 2023-06-05 MED ORDER — OXYCODONE HCL 5 MG PO TABS: 5.0000 mg | ORAL_TABLET | ORAL | 0 refills | Status: DC | PRN

## 2023-06-05 MED ORDER — ROCURONIUM BROMIDE 10 MG/ML (PF) SYRINGE
PREFILLED_SYRINGE | INTRAVENOUS | Status: DC | PRN
  Administered 2023-06-05: 60 mg via INTRAVENOUS

## 2023-06-05 MED ORDER — ACETAMINOPHEN 500 MG PO TABS: 1000.0000 mg | ORAL_TABLET | Freq: Once | ORAL | Status: DC

## 2023-06-05 MED ORDER — SUGAMMADEX SODIUM 200 MG/2ML IV SOLN
INTRAVENOUS | Status: DC | PRN
  Administered 2023-06-05: 176 mg via INTRAVENOUS

## 2023-06-05 MED ORDER — ACETAMINOPHEN 500 MG PO TABS
ORAL_TABLET | ORAL | Status: AC
  Filled 2023-06-05: qty 2

## 2023-06-05 MED ORDER — CELECOXIB 200 MG PO CAPS
400.0000 mg | ORAL_CAPSULE | ORAL | Status: AC
  Administered 2023-06-05: 400 mg via ORAL

## 2023-06-05 MED ORDER — ACETAMINOPHEN 500 MG PO TABS
1000.0000 mg | ORAL_TABLET | ORAL | Status: AC
  Administered 2023-06-05: 1000 mg via ORAL

## 2023-06-05 MED ORDER — MIDAZOLAM HCL 2 MG/2ML IJ SOLN
INTRAMUSCULAR | Status: DC | PRN
  Administered 2023-06-05: 2 mg via INTRAVENOUS

## 2023-06-05 MED ORDER — IBUPROFEN 600 MG PO TABS: 600.0000 mg | ORAL_TABLET | Freq: Four times a day (QID) | ORAL | 0 refills | Status: DC | PRN

## 2023-06-05 MED ORDER — ONDANSETRON HCL 4 MG/2ML IJ SOLN
INTRAMUSCULAR | Status: DC | PRN
  Administered 2023-06-05: 4 mg via INTRAVENOUS

## 2023-06-05 MED ORDER — ACETIC ACID 5 % SOLN
Status: AC
  Filled 2023-06-05: qty 50

## 2023-06-05 MED ORDER — SENNA 8.6 MG PO TABS: 2.0000 | ORAL_TABLET | Freq: Every evening | ORAL | 0 refills | Status: DC | PRN

## 2023-06-05 MED ORDER — DEXAMETHASONE SODIUM PHOSPHATE 10 MG/ML IJ SOLN
INTRAMUSCULAR | Status: DC | PRN
  Administered 2023-06-05: 5 mg via INTRAVENOUS

## 2023-06-05 MED ORDER — IODINE STRONG (LUGOLS) 5 % PO SOLN
ORAL | Status: AC
  Filled 2023-06-05: qty 1

## 2023-06-05 MED ORDER — CELECOXIB 200 MG PO CAPS
ORAL_CAPSULE | ORAL | Status: AC
  Filled 2023-06-05: qty 2

## 2023-06-05 MED ORDER — PHENYLEPHRINE 80 MCG/ML (10ML) SYRINGE FOR IV PUSH (FOR BLOOD PRESSURE SUPPORT)
PREFILLED_SYRINGE | INTRAVENOUS | Status: DC | PRN
  Administered 2023-06-05: 120 ug via INTRAVENOUS
  Administered 2023-06-05 (×3): 80 ug via INTRAVENOUS
  Administered 2023-06-05: 120 ug via INTRAVENOUS
  Administered 2023-06-05 (×4): 80 ug via INTRAVENOUS

## 2023-06-05 MED ORDER — FENTANYL CITRATE (PF) 250 MCG/5ML IJ SOLN
INTRAMUSCULAR | Status: DC | PRN
  Administered 2023-06-05 (×3): 50 ug via INTRAVENOUS

## 2023-06-05 MED ORDER — LACTATED RINGERS IV SOLN: INTRAVENOUS | Status: DC

## 2023-06-05 MED ORDER — OXYCODONE HCL 5 MG/5ML PO SOLN: 5.0000 mg | Freq: Once | ORAL | Status: DC | PRN

## 2023-06-05 MED ORDER — POVIDONE-IODINE 10 % EX SWAB: 2.0000 | Freq: Once | CUTANEOUS | Status: DC

## 2023-06-05 MED ORDER — STERILE WATER FOR IRRIGATION IR SOLN
Status: DC | PRN
  Administered 2023-06-05: 1000 mL

## 2023-06-05 MED ORDER — ONDANSETRON 4 MG PO TBDP: 4.0000 mg | ORAL_TABLET | Freq: Four times a day (QID) | ORAL | 0 refills | Status: DC | PRN

## 2023-06-05 MED ORDER — SCOPOLAMINE 1 MG/3DAYS TD PT72
MEDICATED_PATCH | TRANSDERMAL | Status: AC
  Filled 2023-06-05: qty 1

## 2023-06-05 MED ORDER — LIDOCAINE 2% (20 MG/ML) 5 ML SYRINGE
INTRAMUSCULAR | Status: DC | PRN
  Administered 2023-06-05: 40 mg via INTRAVENOUS

## 2023-06-05 MED ORDER — ACETAMINOPHEN 500 MG PO TABS: 1000.0000 mg | ORAL_TABLET | Freq: Three times a day (TID) | ORAL | 0 refills | Status: DC

## 2023-06-05 MED ORDER — ORAL CARE MOUTH RINSE: 15.0000 mL | Freq: Once | OROMUCOSAL | Status: AC

## 2023-06-05 MED ORDER — SCOPOLAMINE 1 MG/3DAYS TD PT72
1.0000 | MEDICATED_PATCH | TRANSDERMAL | Status: DC
  Administered 2023-06-05: 1.5 mg via TRANSDERMAL

## 2023-06-05 MED ORDER — MIDAZOLAM HCL 2 MG/2ML IJ SOLN
INTRAMUSCULAR | Status: AC
  Filled 2023-06-05: qty 2

## 2023-06-05 MED ORDER — CHLORHEXIDINE GLUCONATE 0.12 % MT SOLN
15.0000 mL | Freq: Once | OROMUCOSAL | Status: AC
  Administered 2023-06-05: 15 mL via OROMUCOSAL

## 2023-06-05 MED ORDER — CHLORHEXIDINE GLUCONATE 0.12 % MT SOLN
OROMUCOSAL | Status: AC
  Filled 2023-06-05: qty 15

## 2023-06-05 MED ORDER — MIDAZOLAM HCL 2 MG/2ML IJ SOLN: 0.5000 mg | Freq: Once | INTRAMUSCULAR | Status: DC | PRN

## 2023-06-05 MED ORDER — MEPERIDINE HCL 25 MG/ML IJ SOLN: 6.2500 mg | INTRAMUSCULAR | Status: DC | PRN

## 2023-06-05 MED ORDER — BUPIVACAINE HCL (PF) 0.25 % IJ SOLN
INTRAMUSCULAR | Status: DC | PRN
  Administered 2023-06-05: 5 mL

## 2023-06-05 MED ORDER — SILVER NITRATE-POT NITRATE 75-25 % EX MISC
CUTANEOUS | Status: AC
  Filled 2023-06-05: qty 10

## 2023-06-05 MED ORDER — OXYCODONE HCL 5 MG PO TABS: 5.0000 mg | ORAL_TABLET | Freq: Once | ORAL | Status: DC | PRN

## 2023-06-05 SURGICAL SUPPLY — 38 items
BAG COUNTER SPONGE SURGICOUNT (BAG) ×2 IMPLANT
DERMABOND ADVANCED .7 DNX12 (GAUZE/BANDAGES/DRESSINGS) ×2 IMPLANT
DRAPE SURG IRRIG POUCH 19X23 (DRAPES) ×2 IMPLANT
DURAPREP 26ML APPLICATOR (WOUND CARE) ×2 IMPLANT
ELECT BALL LEEP 5MM RED (ELECTRODE) IMPLANT
ELECT LOOP LEEP RND 15X12 GRN (CUTTING LOOP) ×2 IMPLANT
ELECT LOOP LEEP SQR 10X10 ORG (CUTTING LOOP) ×2 IMPLANT
ELECTRODE LOOP LP RND 15X12GRN (CUTTING LOOP) IMPLANT
ELECTRODE LOOP LP SQR 10X10ORG (CUTTING LOOP) IMPLANT
GLOVE BIO SURGEON STRL SZ7 (GLOVE) ×4 IMPLANT
GLOVE BIOGEL PI IND STRL 7.0 (GLOVE) ×4 IMPLANT
GOWN STRL REUS W/TWL XL LVL3 (GOWN DISPOSABLE) ×6 IMPLANT
IRRIG SUCT STRYKERFLOW 2 WTIP (MISCELLANEOUS) ×2 IMPLANT
IRRIGATION SUCT STRKRFLW 2 WTP (MISCELLANEOUS) ×2 IMPLANT
KIT PINK PAD W/HEAD ARE REST (MISCELLANEOUS) ×2 IMPLANT
KIT PINK PAD W/HEAD ARM REST (MISCELLANEOUS) ×2 IMPLANT
KIT TURNOVER CYSTO (KITS) ×2 IMPLANT
LIGASURE VESSEL 5MM BLUNT TIP (ELECTROSURGICAL) IMPLANT
NDL INSUFFLATION 14GA 120MM (NEEDLE) ×2 IMPLANT
NEEDLE INSUFFLATION 14GA 120MM (NEEDLE) ×2 IMPLANT
PACK LAPAROSCOPY BASIN (CUSTOM PROCEDURE TRAY) ×2 IMPLANT
POUCH LAPAROSCOPIC INSTRUMENT (MISCELLANEOUS) ×2 IMPLANT
PROTECTOR NERVE ULNAR (MISCELLANEOUS) ×4 IMPLANT
SEALER TISSUE G2 CVD JAW 35 (ENDOMECHANICALS) IMPLANT
SLEEVE ADV FIXATION 5X100MM (TROCAR) IMPLANT
SUT SILK 0 FSL (SUTURE) IMPLANT
SUT VIC AB 4-0 PS2 27 (SUTURE) ×2 IMPLANT
SUT VICRYL 0 UR6 27IN ABS (SUTURE) IMPLANT
SWAB OB/GYN 8 NS (MISCELLANEOUS) IMPLANT
SYR 30ML LL (SYRINGE) IMPLANT
SYS BAG RETRIEVAL 10MM (BASKET) IMPLANT
SYSTEM BAG RETRIEVAL 10MM (BASKET) IMPLANT
SYSTEM CARTER THOMASON II (TROCAR) IMPLANT
TOWEL OR 17X24 6PK STRL BLUE (TOWEL DISPOSABLE) ×2 IMPLANT
TRAY FOLEY W/BAG SLVR 14FR LF (SET/KITS/TRAYS/PACK) ×2 IMPLANT
TROCAR ADV FIXATION 5X100MM (TROCAR) ×2 IMPLANT
TROCAR BALLN 12MMX100 BLUNT (TROCAR) IMPLANT
WARMER LAPAROSCOPE (MISCELLANEOUS) ×2 IMPLANT

## 2023-06-05 NOTE — Op Note (Signed)
 Kenitra Peel PROCEDURE DATE: 06/05/2023  PREOPERATIVE DIAGNOSIS:  Undesired fertility, cervical dysplasia (CIN2)  POSTOPERATIVE DIAGNOSIS:  Undesired fertility, cervical dysplasia (CIN2)  PROCEDURE:  Laparoscopic Bilateral Salpingectomy  Loop Electrical Excision Procedure (LEEP)  SURGEON:  Dr. Harvie Bridge  ASSISTANT:  Clement Sayres, PA-C. An experienced assistant was required given the standard of surgical care given the complexity of the case.  This assistant was needed for exposure, dissection, suctioning, retraction, instrument exchange, and for overall help during the procedure.  ANESTHESIA:  General endotracheal  COMPLICATIONS:  None immediate.  ESTIMATED BLOOD LOSS:  15 ml.  FLUIDS: 1500 ml LR.  URINE OUTPUT:  Not collected  INDICATIONS: 33 y.o. G0P0000 with undesired fertility, desires permanent sterilization and cervical dysplasia. Other forms of contraception were discussed with patient and emphasized alternatives of vasectomy, IUDs and Nexplanon as they have equivalent contraceptive efficacy; she declines all other modalities.  Risks of procedure discussed with patient including permanence of method, risk of regret, bleeding, infection, injury to surrounding organs and need for additional procedures including laparotomy.  Failure risk less than 0.5% with increased risk of ectopic gestation if pregnancy occurs was also discussed with patient.  Written informed consent was obtained.    FINDINGS:  2 small subserosal fibroids, otherwise normal uterus, tubes and ovaries.  Cervix small with circumferential thin acetowhite epithelium.   TECHNIQUE:  After all consents were signed and questions were answered the patient was taken to the operating room where general anesthesia was induced. She was placed in dorsal lithotomy position. She was prepped and draped in the usual sterile fashion in the dorsal supine position. A timeout was performed.  In Hasson fashion, a 10mm  infraumbilical incision was made. The fascia identified with blunt dissection, elevated and divided sharply. 0-vicryl was used to tag the fascia bilaterally. A 10mm Hasson trocar was introduced. After confirmation of intraperitoneal entry, the abdomen was insufflated. The area under the point of entry was carefully inspected and no injury was noted. The patient was then placed in Trendelenburg position.  The scalpel was then used to make two 5mm incsions, one in the LLQ and one suprapubic. 5mm balloon ports were inserted. The fallopian tubes were cauterized, cut, and detached from their surrounding pelvic structures with the Ligasure. No bleeding was noted. The specimens were withdrawn through the 5mm ports. The pelvis was inspected and was hemostatic. All ports were then withdrawn and the gas drained from abdomen. The umbilical fascia was closed with interrupted 0-vicryl. The incisions were then closed in a subcuticular fashion with 4-0 vicryl and dermabond.   Attention was turned to the LEEP portion of the procedure. A bivalved coated speculum was placed in the patient's vagina. Acetic acid was applied to the cervix and the colposcopy was repeated. Local anesthesia was administered via an intracervical block using 5 ml of bupivacaine 0.25%. The suction was turned on and the 10 x 10 mm loop on 50 Watts of blended current was used to excise the entire transformation zone. A post-LEEP ECC was collected. Excellent hemostasis was achieved using roller ball coagulation set at 50 Watts coagulation current. Monsel's solution was then applied and the speculum was removed from the vagina. Specimens were sent to pathology.  The patient will be discharged to home as per PACU criteria.  Routine postoperative instructions given.  She will follow up in the office in 4 wks for postoperative evaluation.  Harvie Bridge, MD Obstetrician & Gynecologist, [facility] for [employer], [facility]

## 2023-06-05 NOTE — Discharge Instructions (Addendum)
 Post-surgical Instructions, Outpatient Surgery  After your LEEP, we placed a gel on the cervix that minimizes bleeding. When it comes out, it looks like coffee grounds or black/brown discharge. This is NORMAL and will go away within 1 week.   You may expect to feel dizzy, weak, and drowsy for as long as 24 hours after receiving the medicine that made you sleep (anesthetic). For the first 24 hours after your surgery:   Do not drive a car, ride a bicycle, participate in physical activities, or take public transportation until you are done taking narcotic pain medicines  Do not drink alcohol or take tranquilizers.  Do not take medicine that has not been prescribed by your physicians.  Do not sign important papers or make important decisions while on narcotic pain medicines.  Have a responsible person with you.   CARE OF INCISION If you have a bandage, you may remove it in one day.  If there are steri-strips or dermabond, just let this loosen on its own.  You may shower on the first day after your surgery.  Do not sit in a tub bath for one month. Avoid heavy lifting (more than 10-15 pounds/4.5 kilograms), pushing, or pulling.  Avoid activities that may risk injury to your incisions.   PAIN MANAGEMENT Ibuprofen 600mg .  (This is the same as 3-200mg  over the counter tablets of Motrin or ibuprofen.)  Take this every 6 hours or as needed for cramping.   Acetaminophen 1000mg  (This is the same as 2-500mg  over the counter extra strength tylenol). Take this every 6 hours for the first 3 days or as needed afterwards for pain Oxycodone 5mg  For more severe pain, take one or two tablets every four to six hours as needed for pain control.  (Remember that narcotic pain medications increase your risk of constipation. You can take the senna nightly to prevent/treat constipation.)  DO'S AND DON'T'S Do not take a tub bath for 4 weeks.  You may shower on the first day after your surgery Do not do any heavy lifting  for four weeks.  This increases the chance of bleeding. Do move around as you feel able.  Stairs are fine.  You may begin to exercise again as you feel able.  Do not lift any weights for tfour weeks. Do not put anything in the vagina for four weeks--no tampons, intercourse, or douching.    REGULAR MEDIATIONS/VITAMINS: You may restart all of your regular medications as prescribed. You may restart all of your vitamins as you normally take them.    PLEASE CALL OR SEEK MEDICAL CARE IF: You have persistent nausea and vomiting.  You have trouble eating or drinking.  You have an oral temperature above 100.5.  You have constipation that is not helped by adjusting diet or increasing fluid intake. Pain medicines are a common cause of constipation.  You have heavy vaginal bleeding - soaking pads in less than 1 hour You have redness or drainage from your incision(s) or there is increasing pain or tenderness near or in the surgical site.

## 2023-06-05 NOTE — Transfer of Care (Signed)
 Immediate Anesthesia Transfer of Care Note  Patient: Danielle [name]  Procedure(s) Performed: LAPAROSCOPIC BILATERAL SALPINGECTOMY (Bilateral: Pelvis) LOOP ELECTROSURGICAL EXCISION PROCEDURE (LEEP) (Cervix)  Patient Location: PACU  Anesthesia Type:General  Level of Consciousness: drowsy, patient cooperative, and responds to stimulation  Airway & Oxygen Therapy: Patient Spontanous Breathing  Post-op Assessment: Report given to RN and Post -op Vital signs reviewed and stable  Post vital signs: Reviewed and stable  Last Vitals:  Vitals Value Taken Time  BP 120/79 06/05/23 1751  Temp    Pulse 104 06/05/23 1753  Resp 8 06/05/23 1753  SpO2 100 % 06/05/23 1753  Vitals shown include unfiled device data.  Last Pain:  Vitals:   06/05/23 1345  TempSrc: Oral  PainSc: 0-No pain      Patients Stated Pain Goal: 5 (06/05/23 1345)  Complications: No notable events documented.

## 2023-06-05 NOTE — Anesthesia Preprocedure Evaluation (Addendum)
 Anesthesia Evaluation  Patient identified by MRN, date of birth, ID band Patient awake    Reviewed: Allergy & Precautions, NPO status , Patient's Chart, lab work & pertinent test results  History of Anesthesia Complications Negative for: history of anesthetic complications  Airway Mallampati: I  TM Distance: >3 FB Neck ROM: Full    Dental  (+) Dental Advisory Given   Pulmonary Current Smoker (Vapes THC) and Patient abstained from smoking.   breath sounds clear to auscultation       Cardiovascular (-) angina  Rhythm:Regular Rate:Normal     Neuro/Psych  Headaches PSYCHIATRIC DISORDERS (ADD) Anxiety Depression       GI/Hepatic negative GI ROS,,,(+)     substance abuse  marijuana use  Endo/Other  Hypothyroidism    Renal/GU negative Renal ROS     Musculoskeletal   Abdominal   Peds  Hematology negative hematology ROS (+)   Anesthesia Other Findings   Reproductive/Obstetrics                             Anesthesia Physical Anesthesia Plan  ASA: 2  Anesthesia Plan: General   Post-op Pain Management: Tylenol PO (pre-op)*   Induction: Intravenous  PONV Risk Score and Plan: 3 and Ondansetron, Dexamethasone and Scopolamine patch - Pre-op  Airway Management Planned: Oral ETT  Additional Equipment: None  Intra-op Plan:   Post-operative Plan: Extubation in OR  Informed Consent: I have reviewed the patients History and Physical, chart, labs and discussed the procedure including the risks, benefits and alternatives for the proposed anesthesia with the patient or authorized representative who has indicated his/her understanding and acceptance.     Dental advisory given  Plan Discussed with: CRNA and Surgeon  Anesthesia Plan Comments:         Anesthesia Quick Evaluation

## 2023-06-05 NOTE — Anesthesia Postprocedure Evaluation (Signed)
 Anesthesia Post Note  Patient: Danielle [name]  Procedure(s) Performed: LAPAROSCOPIC BILATERAL SALPINGECTOMY (Bilateral: Pelvis) LOOP ELECTROSURGICAL EXCISION PROCEDURE (LEEP) (Cervix)     Patient location during evaluation: PACU Anesthesia Type: General Level of consciousness: awake and alert, patient cooperative and oriented Pain management: pain level controlled Vital Signs Assessment: post-procedure vital signs reviewed and stable Respiratory status: spontaneous breathing, nonlabored ventilation and respiratory function stable Cardiovascular status: blood pressure returned to baseline and stable Postop Assessment: no apparent nausea or vomiting Anesthetic complications: no   No notable events documented.  Last Vitals:  Vitals:   06/05/23 1800 06/05/23 1815  BP: 111/75 107/77  Pulse: (!) 104 96  Resp: 12 10  Temp:    SpO2: 100% 100%    Last Pain:  Vitals:   06/05/23 1815  TempSrc:   PainSc: Asleep                 Eaden Hettinger,E. Xzavian Semmel

## 2023-06-05 NOTE — H&P (Addendum)
   PRE OPERATIVE HISTORY AND PHYSICAL  Subjective:  Danielle [name] is a 33 y.o. G0P0000 presenting for laparoscopic bilateral salpingectomy for permanent sterilization and LEEP for cervical dysplasia (CIN2)  Dysplasia Hx: - Pap 03/12/23 ASCUS/HPV+ - Colpo 05/14/23 LSIL 10 & 12 o'clock & CIN2 on ECC  Pt initially seen 03/2023 - expressed certainty about her decision to not have children in the future, rating her certainty as a 10 on a scale of 1 to 10. The patient has been considering this decision for a couple of years and feels that it will provide her with reassurance and protection. She has previously used birth control pills, but has experienced mood effects with some formulations. She is currently using a low estrogen pill Rolly Salter) which she reports tolerating well. She is not interested in LARC.   She confirms her desire for permanent sterilization today. She is sure of her decision.   PMH: anxiety, hypothyroidism, ADD PSH: EGD, ear tubes Meds: COCs, wellbutrin, adderall, synthroid, klonopin prn OB: G0 Gyn: Pap ASCUS/HPV+, needs colpo Soc: +THC vape, occ alcohol. Denies tobacco, cocaine, methamphetamines, opiates.  Objective:   Vitals:   05/29/23 1457 06/05/23 1345  BP:  117/78  Pulse:  (!) 106  Resp:  16  Temp:  97.8 F (36.6 C)  TempSrc:  Oral  SpO2:  100%  Weight: 44 kg 44 kg  Height: 5' (1.524 m) 5' (1.524 m)   General:  Alert, oriented and cooperative. Patient is in no acute distress.  Skin: Skin is warm and dry. No rash noted.   Cardiovascular: Normal heart rate noted  Respiratory: Normal respiratory effort, no problems with respiration noted  Abdomen: Soft, non-tender, non-distended    Assessment and Plan:  Danielle [name] is a 33 y.o. with request for permanent sterilization and cervical dysplasia presenting for laparoscopic bilateral salpingectomy and LEEP  - Diagnosis: undesired fertility, cervical dysplasia (CIN2) - Risks of surgery include but are not  limited to: bleeding, infection, injury to surrounding organs/tissues (i.e. bowel/bladder/ureters), need for additional procedures, wound complications, hospital re-admission, and conversion to open surgery, VTE, regret and unwanted pregnancy/contraceptive failure - We discussed postop restrictions, precautions and expectations - All questions answered - Consent signed. No pre op abx indicated  To OR when ready  Future Appointments  Date Time Provider Department Center  07/10/2023 10:50 AM Jomarie Longs, PA-C [facility] None    Lennart Pall, MD

## 2023-06-05 NOTE — Anesthesia Procedure Notes (Signed)
 Procedure Name: Intubation Date/Time: 06/05/2023 4:18 PM  Performed by: Loleta Brittanie Dosanjh, CRNAPre-anesthesia Checklist: Patient identified, Patient being monitored, Timeout performed, Emergency Drugs available and Suction available Patient Re-evaluated:Patient Re-evaluated prior to induction Oxygen Delivery Method: Circle system utilized Preoxygenation: Pre-oxygenation with 100% oxygen Induction Type: IV induction Ventilation: Mask ventilation without difficulty Laryngoscope Size: Miller and 2 Grade View: Grade I Tube type: Oral Tube size: 7.0 mm Number of attempts: 1 Airway Equipment and Method: Stylet Placement Confirmation: ETT inserted through vocal cords under direct vision, positive ETCO2 and breath sounds checked- equal and bilateral Secured at: 22 cm Tube secured with: Tape Dental Injury: Teeth and Oropharynx as per pre-operative assessment

## 2023-06-06 ENCOUNTER — Encounter (HOSPITAL_COMMUNITY): Payer: Self-pay | Admitting: Obstetrics and Gynecology

## 2023-06-07 LAB — SURGICAL PATHOLOGY

## 2023-06-11 ENCOUNTER — Encounter: Payer: Self-pay | Admitting: Obstetrics and Gynecology

## 2023-07-02 NOTE — Progress Notes (Unsigned)
   POST OP VISIT  Subjective:  Danielle [name] is a 33 y.o. G0P0000  4 weeks s/p lsc BS & LEEP presenting for post op appt  Path: - Unremarkable fallopian tubes - CIN2, margins negative, ECC benign  Intra-op images reviewed - small fibroids, possible fibrosis ?endo near L uterosacral ligament (not biopsied)  Has recovered well. Had some drainage from umbilical incision but this has stopped. Ambulating, tolerating regular diet. No major concerns today  Objective:   Vitals:   07/04/23 1052  BP: 106/74  Pulse: (!) 118  Weight: 97 lb (44 kg)  Height: 5' (1.524 m)   General:  Alert, oriented and cooperative. Patient is in no acute distress.  Skin: Skin is warm and dry. No rash noted.   Cardiovascular: Normal heart rate noted  Respiratory: Normal respiratory effort, no problems with respiration noted  Abdomen: Soft, non-tender, non-distended   Pelvic: NEFG. Cervix well healed.   Exam performed in the presence of a chaperone  Assessment and Plan:  Danielle [name] is a 33 y.o. 4wk s/p lsc BS & LEEP  Cotest in 6 months No further post op restrictions Reviewed intra-op images and path  Return in about 6 months (around 01/03/2024).  Future Appointments  Date Time Provider Department Center  07/10/2023 10:50 AM Jomarie Longs, PA-C [facility] None   Lennart Pall, MD

## 2023-07-04 ENCOUNTER — Encounter: Payer: Self-pay | Admitting: Obstetrics and Gynecology

## 2023-07-04 ENCOUNTER — Ambulatory Visit (INDEPENDENT_AMBULATORY_CARE_PROVIDER_SITE_OTHER): Admitting: Obstetrics and Gynecology

## 2023-07-04 VITALS — BP 106/74 | HR 118 | Ht 60.0 in | Wt 97.0 lb

## 2023-07-04 DIAGNOSIS — Z09 Encounter for follow-up examination after completed treatment for conditions other than malignant neoplasm: Secondary | ICD-10-CM | POA: Diagnosis not present

## 2023-07-04 MED ORDER — JUNEL FE 24 1-20 MG-MCG(24) PO TABS: 1.0000 | ORAL_TABLET | Freq: Every day | ORAL | 3 refills | Status: AC

## 2023-07-08 ENCOUNTER — Other Ambulatory Visit: Payer: Self-pay | Admitting: Physician Assistant

## 2023-07-08 DIAGNOSIS — F988 Other specified behavioral and emotional disorders with onset usually occurring in childhood and adolescence: Secondary | ICD-10-CM

## 2023-07-08 DIAGNOSIS — R4589 Other symptoms and signs involving emotional state: Secondary | ICD-10-CM

## 2023-07-08 DIAGNOSIS — F411 Generalized anxiety disorder: Secondary | ICD-10-CM

## 2023-07-10 ENCOUNTER — Encounter: Payer: Self-pay | Admitting: Physician Assistant

## 2023-07-10 ENCOUNTER — Ambulatory Visit: Payer: Medicaid Other | Admitting: Physician Assistant

## 2023-07-10 VITALS — BP 111/64 | HR 103 | Wt 98.9 lb

## 2023-07-10 DIAGNOSIS — F988 Other specified behavioral and emotional disorders with onset usually occurring in childhood and adolescence: Secondary | ICD-10-CM

## 2023-07-10 DIAGNOSIS — F410 Panic disorder [episodic paroxysmal anxiety] without agoraphobia: Secondary | ICD-10-CM | POA: Diagnosis not present

## 2023-07-10 DIAGNOSIS — R4589 Other symptoms and signs involving emotional state: Secondary | ICD-10-CM | POA: Diagnosis not present

## 2023-07-10 DIAGNOSIS — F411 Generalized anxiety disorder: Secondary | ICD-10-CM

## 2023-07-10 MED ORDER — LISDEXAMFETAMINE DIMESYLATE 30 MG PO CAPS
30.0000 mg | ORAL_CAPSULE | Freq: Every day | ORAL | 0 refills | Status: DC
Start: 2023-07-10 — End: 2023-08-10

## 2023-07-10 MED ORDER — CLONAZEPAM 0.5 MG PO TABS: ORAL_TABLET | ORAL | 2 refills | Status: DC

## 2023-07-11 ENCOUNTER — Encounter: Payer: Self-pay | Admitting: Physician Assistant

## 2023-07-11 NOTE — Progress Notes (Signed)
 Established Patient Office Visit  Subjective   Patient ID: Valisa Karpel, female    DOB: 04/12/1990  Age: 33 y.o. MRN: [id number]  Chief Complaint  Patient presents with   Medical Management of Chronic Issues    3 month fup adhd    HPI Pt is a 33 yo female who presents to the clinic for follow up and medication refills.   She is doing well. She had BTL surgery with no complications. She did feel like coming off her birth control made her mood worse and acne worse. She went back on it.   She is interested in trying vyanse to replace adderall. She is doing ok on adderall but never tried anything else.    ROS See HPI.  Objective:     BP 111/64   Pulse (!) 103   Wt 98 lb 14.4 oz (44.9 kg)   SpO2 99%   BMI 19.32 kg/m  BP Readings from Last 3 Encounters:  07/10/23 111/64  07/04/23 106/74  06/05/23 114/81   Wt Readings from Last 3 Encounters:  07/10/23 98 lb 14.4 oz (44.9 kg)  07/04/23 97 lb (44 kg)  06/05/23 97 lb (44 kg)    ..    07/10/2023   11:12 AM 04/11/2023   11:38 AM 02/23/2023   11:09 AM 08/22/2022   11:32 AM 02/08/2022   11:04 AM  Depression screen PHQ 2/9  Decreased Interest 0 0 1 0 0  Down, Depressed, Hopeless 0 1 1 1  0  PHQ - 2 Score 0 1 2 1  0  Altered sleeping 1 1 2  0 1  Tired, decreased energy 0 2 0 1 1  Change in appetite 0 0 0 0 0  Feeling bad or failure about yourself  0 0 0 0 0  Trouble concentrating 0 0 0 0 0  Moving slowly or fidgety/restless 0 0 0 0 0  Suicidal thoughts 0 0 0 0 0  PHQ-9 Score 1 4 4 2 2   Difficult doing work/chores Somewhat difficult Somewhat difficult Not difficult at all Not difficult at all Not difficult at all   ..    07/10/2023   11:12 AM 04/11/2023   11:38 AM 02/23/2023   11:10 AM 08/22/2022   11:33 AM  GAD 7 : Generalized Anxiety Score  Nervous, Anxious, on Edge 1 1 2 1   Control/stop worrying 0 0 0 0  Worry too much - different things 1 1 0 0  Trouble relaxing 0 0 0 0  Restless 0 0 0 0  Easily annoyed or  irritable 0 0 0 0  Afraid - awful might happen 0 0 0 0  Total GAD 7 Score 2 2 2 1   Anxiety Difficulty Somewhat difficult Somewhat difficult Not difficult at all Somewhat difficult      Physical Exam Constitutional:      Appearance: Normal appearance.  HENT:     Head: Normocephalic.  Cardiovascular:     Rate and Rhythm: Normal rate and regular rhythm.  Pulmonary:     Effort: Pulmonary effort is normal.     Breath sounds: Normal breath sounds.  Neurological:     General: No focal deficit present.     Mental Status: She is alert and oriented to person, place, and time.  Psychiatric:        Mood and Affect: Mood normal.        Assessment & Plan:  Marland KitchenMarland KitchenHayley was seen today for medical management of chronic issues.  Diagnoses and all  orders for this visit:  Attention deficit disorder (ADD) without hyperactivity -     lisdexamfetamine (VYVANSE) 30 MG capsule; Take 1 capsule (30 mg total) by mouth daily.  Panic attack -     clonazePAM (KLONOPIN) 0.5 MG tablet; TAKE ONE TABLET BY MOUTH TWICE A DAY AS NEEDED FOR ANXIETY  Depressed mood  GAD (generalized anxiety disorder)   PHQ/GAD to goal Refilled klonapin  Stop adderall and will try vyvanse for the next month If doing well will send in the rest of prescription   Return in about 3 months (around 10/09/2023).    Tandy Gaw, PA-C

## 2023-08-09 ENCOUNTER — Encounter: Payer: Self-pay | Admitting: Physician Assistant

## 2023-08-10 ENCOUNTER — Other Ambulatory Visit: Payer: Self-pay

## 2023-08-10 MED ORDER — LISDEXAMFETAMINE DIMESYLATE 40 MG PO CAPS: 40.0000 mg | ORAL_CAPSULE | ORAL | 0 refills | Status: DC

## 2023-08-10 NOTE — Telephone Encounter (Signed)
 Called pharmacy and spoke to [place]. She would like to clarify medications. Triage RN relayed that per last PCP note (07/10/2023), PCP discontinued Adderall and started pt on Vyvance. Additionally relayed correspondence below: Araceli Knight, PA-C to [facility]    08/10/23  4:06 PM Ok I sent 2 months of the 40mg  dose!    Jade   Copied from [employer] [id number]. Topic: Clinical - Medical Advice >> Aug 10, 2023  5:21 PM Kevelyn M wrote: Reason for CRM: Bridgette Campus from [facility] is asking if lisdexamfetamine (VYVANSE ) 40 MG capsule should be refilled since Adderall was just filled not too long ago. [phone]

## 2023-10-09 ENCOUNTER — Encounter: Payer: Self-pay | Admitting: Physician Assistant

## 2023-10-09 ENCOUNTER — Ambulatory Visit: Admitting: Physician Assistant

## 2023-10-09 VITALS — BP 117/76 | HR 99 | Ht 60.0 in | Wt 99.0 lb

## 2023-10-09 DIAGNOSIS — F410 Panic disorder [episodic paroxysmal anxiety] without agoraphobia: Secondary | ICD-10-CM | POA: Diagnosis not present

## 2023-10-09 DIAGNOSIS — F988 Other specified behavioral and emotional disorders with onset usually occurring in childhood and adolescence: Secondary | ICD-10-CM | POA: Diagnosis not present

## 2023-10-09 DIAGNOSIS — R4589 Other symptoms and signs involving emotional state: Secondary | ICD-10-CM | POA: Diagnosis not present

## 2023-10-09 DIAGNOSIS — G479 Sleep disorder, unspecified: Secondary | ICD-10-CM | POA: Insufficient documentation

## 2023-10-09 DIAGNOSIS — F411 Generalized anxiety disorder: Secondary | ICD-10-CM | POA: Diagnosis not present

## 2023-10-09 DIAGNOSIS — E039 Hypothyroidism, unspecified: Secondary | ICD-10-CM | POA: Diagnosis not present

## 2023-10-09 MED ORDER — LISDEXAMFETAMINE DIMESYLATE 40 MG PO CAPS: 40.0000 mg | ORAL_CAPSULE | ORAL | 0 refills | Status: DC

## 2023-10-09 MED ORDER — LEVOTHYROXINE SODIUM 50 MCG PO TABS
50.0000 ug | ORAL_TABLET | Freq: Every day | ORAL | 3 refills | Status: AC
Start: 2023-10-09 — End: ?

## 2023-10-09 MED ORDER — BUPROPION HCL ER (XL) 300 MG PO TB24: 300.0000 mg | ORAL_TABLET | Freq: Every day | ORAL | 3 refills | Status: AC

## 2023-10-09 NOTE — Progress Notes (Signed)
 Established Patient Office Visit  Subjective   Patient ID: Danielle [name], female    DOB: 1990/12/30  Age: 33 y.o. MRN: [id number]  Chief Complaint  Patient presents with   ADHD and GAD follow up   Danielle [name] presents to the clinic today for a 3 month follow-up for ADHD and generalized anxiety disorder. She endorses a smooth transition from Adderall to Vyvanse , with a decrease in unwanted side effects. She also states she needs a refill on her Wellbutrin , which she has been taking regularly with no issues. She takes her Klonopin  as needed 1-2x weekly, and endorses occasionally taking it more often during more stressful weeks. Patient is still trying to work on her sleep hygiene, which can be difficult with her work schedule and working into the early morning. Otherwise no concerns at this time.   Review of Systems  Psychiatric/Behavioral:  Negative for depression and suicidal ideas. The patient is not nervous/anxious and does not have insomnia.   All other systems reviewed and are negative.     Objective:     BP 117/76   Pulse 99   Ht 5' (1.524 m)   Wt 99 lb (44.9 kg)   SpO2 100%   BMI 19.33 kg/m     Physical Exam Constitutional:      Appearance: Normal appearance.  HENT:     Head: Normocephalic and atraumatic.  Cardiovascular:     Rate and Rhythm: Normal rate and regular rhythm.     Pulses: Normal pulses.     Heart sounds: Normal heart sounds.  Pulmonary:     Effort: Pulmonary effort is normal.     Breath sounds: Normal breath sounds.  Abdominal:     General: Abdomen is flat.  Musculoskeletal:        General: Normal range of motion.     Cervical back: Normal range of motion and neck supple.  Neurological:     Mental Status: She is alert.  Psychiatric:        Mood and Affect: Mood normal.        Behavior: Behavior normal.        Thought Content: Thought content normal.    ..    10/09/2023   11:19 AM 07/10/2023   11:12 AM 04/11/2023   11:38 AM 02/23/2023    11:09 AM 08/22/2022   11:32 AM  Depression screen PHQ 2/9  Decreased Interest 0 0 0 1 0  Down, Depressed, Hopeless 0 0 1 1 1   PHQ - 2 Score 0 0 1 2 1   Altered sleeping 1 1 1 2  0  Tired, decreased energy 1 0 2 0 1  Change in appetite 0 0 0 0 0  Feeling bad or failure about yourself  0 0 0 0 0  Trouble concentrating 0 0 0 0 0  Moving slowly or fidgety/restless 0 0 0 0 0  Suicidal thoughts 0 0 0 0 0  PHQ-9 Score 2 1 4 4 2   Difficult doing work/chores Somewhat difficult Somewhat difficult Somewhat difficult Not difficult at all Not difficult at all   ..    10/09/2023   11:19 AM 07/10/2023   11:12 AM 04/11/2023   11:38 AM 02/23/2023   11:10 AM  GAD 7 : Generalized Anxiety Score  Nervous, Anxious, on Edge 2 1 1 2   Control/stop worrying 1 0 0 0  Worry too much - different things 2 1 1  0  Trouble relaxing 0 0 0 0  Restless 0 0 0  0  Easily annoyed or irritable 0 0 0 0  Afraid - awful might happen 0 0 0 0  Total GAD 7 Score 5 2 2 2   Anxiety Difficulty Somewhat difficult Somewhat difficult Somewhat difficult Not difficult at all       Assessment & Plan:  Danielle [name] was seen today for adhd and gad follow up.  Diagnoses and all orders for this visit:  Hypothyroidism, unspecified type -     levothyroxine  (SYNTHROID ) 50 MCG tablet; Take 1 tablet (50 mcg total) by mouth daily.  Attention deficit disorder (ADD) without hyperactivity -     buPROPion  (WELLBUTRIN  XL) 300 MG 24 hr tablet; Take 1 tablet (300 mg total) by mouth daily.  Depressed mood -     buPROPion  (WELLBUTRIN  XL) 300 MG 24 hr tablet; Take 1 tablet (300 mg total) by mouth daily.  GAD (generalized anxiety disorder) -     buPROPion  (WELLBUTRIN  XL) 300 MG 24 hr tablet; Take 1 tablet (300 mg total) by mouth daily.  Trouble in sleeping  Panic attack  Other orders -     lisdexamfetamine (VYVANSE ) 40 MG capsule; Take 1 capsule (40 mg total) by mouth every morning. -     lisdexamfetamine (VYVANSE ) 40 MG capsule; Take 1 capsule  (40 mg total) by mouth every morning. -     lisdexamfetamine (VYVANSE ) 40 MG capsule; Take 1 capsule (40 mg total) by mouth every morning.   Hypothyroid  - TSH last checked 04/2023 (TSH: 3.050) - Continue levothyroxine , refills sent in today   ADD Depressed mood GAD - PHQ-9 score: 2 ; GAD-7 score: 7 - Continue Vyvanse , refills sent in today- call at 3 months for refills to be sent in again - Continue Wellbutrin , refills sent in today  - Continue Klonopin , as needed (taking 1-2x/wk), does not need refills today - Discussed sleep hygiene, recommended supplements (ashwaghanda, calm-aid, magnesium, valerian root)     Return in about 6 months (around 04/10/2024).    Letita Prentiss, PA-C

## 2024-01-07 ENCOUNTER — Encounter: Payer: Self-pay | Admitting: Obstetrics and Gynecology

## 2024-01-09 ENCOUNTER — Encounter: Payer: Self-pay | Admitting: Physician Assistant

## 2024-01-09 DIAGNOSIS — F410 Panic disorder [episodic paroxysmal anxiety] without agoraphobia: Secondary | ICD-10-CM

## 2024-01-09 MED ORDER — LISDEXAMFETAMINE DIMESYLATE 40 MG PO CAPS: 40.0000 mg | ORAL_CAPSULE | ORAL | 0 refills | Status: DC

## 2024-01-09 MED ORDER — CLONAZEPAM 0.5 MG PO TABS: ORAL_TABLET | ORAL | 2 refills | Status: DC

## 2024-01-09 NOTE — Telephone Encounter (Signed)
 Patient requesting rx rf of  Vyvanse  40mg   Last written 09/06/225 Clonazepam  0.5mg   Last written 07/10/2023 Last OV 10/09/2023 Upcoming appt 04/09/2024

## 2024-02-11 ENCOUNTER — Telehealth: Payer: Self-pay | Admitting: *Deleted

## 2024-02-11 ENCOUNTER — Ambulatory Visit: Admitting: Obstetrics & Gynecology

## 2024-02-11 NOTE — Telephone Encounter (Signed)
 Left patient a message to call the office about scheduled appointment today.

## 2024-02-25 ENCOUNTER — Ambulatory Visit: Payer: Self-pay | Admitting: Family Medicine

## 2024-02-25 ENCOUNTER — Encounter: Payer: Self-pay | Admitting: Family Medicine

## 2024-02-25 VITALS — BP 121/78 | HR 115 | Temp 98.3°F | Ht 60.0 in | Wt 98.0 lb

## 2024-02-25 DIAGNOSIS — J029 Acute pharyngitis, unspecified: Secondary | ICD-10-CM

## 2024-02-25 LAB — POCT RAPID STREP A (OFFICE): Rapid Strep A Screen: NEGATIVE

## 2024-02-25 NOTE — Progress Notes (Signed)
 Acute Office Visit  Patient ID: Danielle [name], female    DOB: [birth year], 33 y.o.   MRN: [id number]  PCP: Antoniette Vermell CROME, PA-C  Chief Complaint  Patient presents with   Sore Throat    Subjective:     HPI  Discussed the use of AI scribe software for clinical note transcription with the patient, who gave verbal consent to proceed.  History of Present Illness Danielle [name] is a 33 year old female who presents with a sore throat and stomach cramps.  Pharyngitis and odynophagia - Sore throat onset upon awakening - White coating observed in throat - Sore throat has improved but sensation has shifted to one side, initially the left - Difficulty swallowing due to throat pain - No cough or sneezing - Mild bilateral ear pain, not severe  Fever - Fever experienced with home thermometer reading approximately 100F - Uncertainty regarding accuracy of temperature measurement  Gastrointestinal symptoms - Severe stomach cramps without diarrhea - Nausea and pallor, especially after showering - No mention of vomiting  Functional impact - Missed work on Thursday due to symptoms, which is atypical for her - 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  HENT:     Head: Normocephalic and atraumatic.     Right Ear: Tympanic membrane, ear canal and external ear normal. There is no impacted cerumen.     Left Ear: Tympanic membrane, ear canal and external ear normal. There is no impacted cerumen.     Nose: Nose normal.     Mouth/Throat:     Pharynx: Oropharynx is clear. Posterior oropharyngeal  erythema present. No oropharyngeal exudate.     Comments: Mild swelling, more on the left than right.  Eyes:     Conjunctiva/sclera: Conjunctivae normal.  Cardiovascular:     Rate and Rhythm: Normal rate and regular rhythm.  Pulmonary:     Effort: Pulmonary effort is normal.     Breath sounds: Normal breath sounds.  Musculoskeletal:     Cervical back: Neck supple. No tenderness.  Lymphadenopathy:     Cervical: No cervical adenopathy.  Skin:    General: Skin is warm and dry.  Neurological:     Mental Status: She is alert and oriented to person, place, and time.  Psychiatric:        Mood and Affect: Mood normal.       Results for orders placed or performed in visit on 02/25/24  POCT rapid strep A  Result Value Ref Range   Rapid Strep A Screen Negative Negative       Assessment & Plan:   Problem List Items Addressed This Visit   None Visit Diagnoses       Sore throat    -  Primary   Relevant Orders   POCT rapid strep A (Completed)     Viral pharyngitis           Assessment and Plan Assessment & Plan Acute viral pharyngitis with left tonsillar swelling  Likely viral etiology. Negative rapid strep test. Symptoms improving, indicating healing phase. - Advised salt water  gargles. - Recommended humidifier use. - Suggested Tylenol  or ibuprofen  for discomfort. - Instructed to contact if symptoms worsen or do not improve by Wednesday. - strep is neg    No orders of the defined types were placed in this encounter.   No follow-ups on file.  Danielle Byars, MD [facility] at [facility]

## 2024-02-25 NOTE — Progress Notes (Signed)
 Pt reports that her sxs began on Wednesday. She stated that it was sudden.

## 2024-03-03 ENCOUNTER — Other Ambulatory Visit (HOSPITAL_COMMUNITY)
Admission: RE | Admit: 2024-03-03 | Discharge: 2024-03-03 | Disposition: A | Payer: Self-pay | Source: Ambulatory Visit | Attending: Obstetrics & Gynecology | Admitting: Obstetrics & Gynecology

## 2024-03-03 ENCOUNTER — Telehealth: Payer: Self-pay | Admitting: *Deleted

## 2024-03-03 ENCOUNTER — Encounter: Payer: Self-pay | Admitting: Obstetrics & Gynecology

## 2024-03-03 ENCOUNTER — Ambulatory Visit: Payer: Self-pay | Admitting: Obstetrics & Gynecology

## 2024-03-03 VITALS — BP 125/86 | HR 125 | Ht 60.0 in | Wt 95.0 lb

## 2024-03-03 DIAGNOSIS — N942 Vaginismus: Secondary | ICD-10-CM

## 2024-03-03 DIAGNOSIS — N871 Moderate cervical dysplasia: Secondary | ICD-10-CM

## 2024-03-03 DIAGNOSIS — M6289 Other specified disorders of muscle: Secondary | ICD-10-CM

## 2024-03-03 DIAGNOSIS — N921 Excessive and frequent menstruation with irregular cycle: Secondary | ICD-10-CM

## 2024-03-03 NOTE — Telephone Encounter (Signed)
 Left patient a message to call about insurance.

## 2024-03-03 NOTE — Progress Notes (Unsigned)
 Colposcopy 05/14/23 BTL and LEEP 06/05/23

## 2024-03-04 NOTE — Progress Notes (Signed)
   Subjective:    Patient ID: Danielle [name], female    DOB: [birth year], 33 y.o.   MRN: [id number]  HPI  33 yo female presents for pap smear after LEEP for HGSIL C. CERVIX, LEEP:  High-grade squamous intraepithelial lesion, CIN-2 (high-grade  dysplasia).  Endocervical and ectocervical margins negative for dysplasia.   D. ENDOCERVIX, CURETTAGE:   Pt also had bTL which she healed well from.  Pt has two other complaints:  Breakthrough bleeding on continuous OCPs. She is currently having brown spotting on week three of OCPs Dyspareunia / pelvic floor dysfunction.  Has tried pelvic PT in the past but not recently.  She had some inusarnce lasp and believes she has Medicaid again.     Review of Systems  Constitutional: Negative.   Respiratory: Negative.    Cardiovascular: Negative.   Gastrointestinal: Negative.   Genitourinary:  Positive for dyspareunia, menstrual problem and vaginal bleeding.  Psychiatric/Behavioral: Negative.         Objective:   Physical Exam Vitals reviewed.  Constitutional:      General: She is not in acute distress.    Appearance: She is well-developed.  HENT:     Head: Normocephalic and atraumatic.  Eyes:     Conjunctiva/sclera: Conjunctivae normal.  Cardiovascular:     Rate and Rhythm: Normal rate.  Pulmonary:     Effort: Pulmonary effort is normal.  Abdominal:     Palpations: Abdomen is soft.     Tenderness: There is no abdominal tenderness.  Genitourinary:    Comments: Involuntary vaginal contraction at introitus Brown blood in vault. Cervix well healed No CMT Skin:    General: Skin is warm and dry.  Neurological:     Mental Status: She is alert and oriented to person, place, and time.  Psychiatric:        Mood and Affect: Mood normal.     Comments: Nervous about exam    Vitals:   03/03/24 1537  BP: 125/86  Pulse: (!) 125  Weight: 95 lb (43.1 kg)  Height: 5' (1.524 m)      Assessment & Plan:   34 yo female with his of HGSIL  and a LEEP here for pap smear.     Pap smear completed.  Will based next steps on result She is still having significant dyspareunia due to pelvic floor dysfunction and vaginismus.  Referral to pelvic PT made today Breakthrough bleeding on OCPs--stop this pack and restart new pack in 1 week.

## 2024-03-06 LAB — CYTOLOGY - PAP
Chlamydia: NEGATIVE
Comment: NEGATIVE
Comment: NEGATIVE
Comment: NEGATIVE
Comment: NEGATIVE
Comment: NORMAL
Diagnosis: UNDETERMINED — AB
HPV 16: NEGATIVE
HPV 18 / 45: NEGATIVE
High risk HPV: POSITIVE — AB
Neisseria Gonorrhea: NEGATIVE

## 2024-03-13 ENCOUNTER — Encounter: Payer: Self-pay | Admitting: Family Medicine

## 2024-03-13 DIAGNOSIS — J029 Acute pharyngitis, unspecified: Secondary | ICD-10-CM

## 2024-03-13 NOTE — Telephone Encounter (Signed)
 Can she come by to be test for mono, esp with prolonged sore throat.  Also rec strep culture this time

## 2024-03-14 ENCOUNTER — Telehealth: Payer: Self-pay

## 2024-03-14 NOTE — Telephone Encounter (Signed)
 Attempted call to patient. Left a voice mail message requesting a return call.

## 2024-03-14 NOTE — Telephone Encounter (Signed)
 Spoke with patient. She states she is already scheduled for visit with Whitney crain for 03/18/2024 concerning symptoms

## 2024-03-14 NOTE — Telephone Encounter (Signed)
 Copied from CRM #[id number]. Topic: General - Other >> Mar 14, 2024  1:41 PM Zebedee SAUNDERS wrote: Reason for CRM: Pt returning Alpheus Suzen SQUIBB, LPN call is scheduled for 03/18/2024.

## 2024-03-17 NOTE — Telephone Encounter (Signed)
 Patient shows scheduled for tomorrow 03/18/2024 with Whitney Crain

## 2024-03-18 ENCOUNTER — Encounter: Payer: Self-pay | Admitting: Urgent Care

## 2024-03-18 ENCOUNTER — Ambulatory Visit: Payer: Self-pay | Admitting: Urgent Care

## 2024-03-18 VITALS — BP 117/80 | HR 98 | Temp 97.9°F | Ht 60.0 in | Wt 97.0 lb

## 2024-03-18 DIAGNOSIS — J039 Acute tonsillitis, unspecified: Secondary | ICD-10-CM

## 2024-03-18 DIAGNOSIS — R590 Localized enlarged lymph nodes: Secondary | ICD-10-CM

## 2024-03-18 NOTE — Progress Notes (Signed)
 Established Patient Office Visit  Subjective:  Patient ID: Danielle [name], female    DOB: 1991/02/08  Age: 33 y.o. MRN: [id number]  Chief Complaint  Patient presents with   Sore Throat    X 1 month    Sore Throat     Discussed the use of AI scribe software for clinical note transcription with the patient, who gave verbal consent to proceed.  History of Present Illness   Danielle [name] is a 33 year old female who presents with a sore throat and swollen tonsils.  She has been experiencing a sore throat that began around November 20th, lasting for approximately four days, during which she felt generally unwell. Improvement was noted around Thanksgiving, but intermittent sore throat symptoms have persisted. The sore throat is worse in the mornings and is associated with swelling of the left tonsil, which has not subsided. She mentions that her right tonsil is usually swollen, but the left one is more problematic currently.  No current white coating on the tongue, which was previously noted. No cough or sneezing. She recalls experiencing ear pain and difficulty swallowing approximately three days ago, but these symptoms resolved spontaneously. No gastrointestinal symptoms, severe fatigue, or fever.  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  A previous strep test was negative, and she has not received any prescription medication for her symptoms. She recalls having mono at age 67 and is currently on thyroid  medication. Her last thyroid  evaluation was in January, with a follow-up scheduled for January 7th.      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   Palpitations 12/28/2016   Vaginismus 09/25/2016   Irritable bowel syndrome with both constipation and diarrhea 07/20/2016   No energy 07/20/2016   Panic attack 05/15/2016   Hypothyroidism 11/11/2014   Vitamin D  deficiency 11/11/2014   Frequent headaches 10/30/2014   Leg numbness 10/30/2014   Generalized anxiety disorder 10/30/2014   Dyspareunia 04/24/2012   Depression 01/04/2012   Past Medical History:  Diagnosis Date   ADD (attention deficit disorder)    Anxiety    Hypothyroidism    Vaginal Pap smear, abnormal    Past Surgical History:  Procedure Laterality Date   LAPAROSCOPIC BILATERAL SALPINGECTOMY Bilateral 06/05/2023   Procedure: LAPAROSCOPIC BILATERAL SALPINGECTOMY;  Surgeon: Erik Kieth BROCKS, MD;  Location: [facility] OR;  Service: Gynecology;  Laterality: Bilateral;  [facility]   LEEP N/A 06/05/2023   Procedure: LOOP ELECTROSURGICAL EXCISION PROCEDURE (LEEP);  Surgeon: Erik Kieth BROCKS, MD;  Location: [facility] OR;  Service: Gynecology;  Laterality: N/A;   UPPER GI ENDOSCOPY     WISDOM TOOTH EXTRACTION     Social History[1]    ROS: as noted in HPI  Objective:     BP 117/80   Pulse 98   Temp 97.9 F (36.6 C) (Oral)   Ht 5' (1.524 m)   Wt 97 lb (44 kg)   LMP  (LMP Unknown)   SpO2 99%   BMI 18.94 kg/m  BP Readings from Last 3 Encounters:  03/18/24 117/80  03/03/24 125/86  02/25/24 121/78   Wt Readings from Last 3 Encounters:  03/18/24 97 lb (44 kg)  03/03/24 95 lb (43.1 kg)  02/25/24 98 lb 0.6 oz (44.5 kg)      Physical Exam Vitals and nursing note reviewed.  Constitutional:      General: She is not in acute distress.    Appearance: She is well-developed and normal weight. She is not ill-appearing, toxic-appearing or diaphoretic.  HENT:     Head: Normocephalic and  atraumatic.     Right Ear: Tympanic membrane and ear canal normal. No drainage, swelling or tenderness. No middle ear effusion. Tympanic membrane is not erythematous.     Left Ear: Tympanic membrane and ear canal normal. No drainage, swelling or tenderness.  No middle ear effusion. Tympanic membrane is not erythematous.     Nose: No congestion or rhinorrhea.     Mouth/Throat:     Mouth: Mucous membranes are moist. No oral lesions.     Pharynx: Oropharynx is clear. Uvula midline. Posterior oropharyngeal erythema present. No pharyngeal swelling, oropharyngeal exudate or uvula swelling.     Tonsils: Tonsillar exudate (L) present. No tonsillar abscesses. 2+ on the right. 2+ on the left.  Eyes:     Extraocular Movements:     Right eye: Normal extraocular motion.     Left eye: Normal extraocular motion.     Conjunctiva/sclera: Conjunctivae normal.     Pupils: Pupils are equal, round, and reactive to light.  Neck:     Thyroid : No thyromegaly.  Cardiovascular:     Rate and Rhythm: Normal rate and regular rhythm.  Pulmonary:     Effort: Pulmonary effort is normal. No respiratory distress.     Breath sounds: Normal breath sounds. No stridor. No wheezing, rhonchi or rales.  Musculoskeletal:     Cervical back: Normal range of motion and neck supple.  Lymphadenopathy:     Cervical: Cervical adenopathy (B submandibular, L anterior cervical) present.  Skin:    General: Skin is warm and dry.     Coloration: Skin is not pale.     Findings: No erythema or rash.  Neurological:     General: No focal deficit present.     Mental Status: She is alert and oriented to person, place, and time.  Psychiatric:        Mood and Affect: Mood normal.        Behavior: Behavior normal.      No results found for any visits on 03/18/24.  Last CBC Lab Results  Component Value Date   WBC 5.6 06/05/2023   HGB 13.8 06/05/2023   HCT 42.2 06/05/2023   MCV 91.7 06/05/2023   MCH 30.0 06/05/2023   RDW 12.8  06/05/2023   PLT 220 06/05/2023   Last metabolic panel Lab Results  Component Value Date   GLUCOSE 105 (H) 04/11/2023   NA 141 04/11/2023   K 3.6 04/11/2023   CL 103 04/11/2023   CO2 24 04/11/2023   BUN 9 04/11/2023   CREATININE 0.76 04/11/2023   EGFR 107 04/11/2023   CALCIUM 9.1 04/11/2023   PROT 6.7 04/11/2023   ALBUMIN 4.4 04/11/2023   LABGLOB 2.3 04/11/2023   BILITOT 1.2 04/11/2023   ALKPHOS 48 04/11/2023   AST 16 04/11/2023   ALT 12 04/11/2023   Last lipids Lab Results  Component Value Date   CHOL 182 04/15/2018   HDL 69 04/15/2018   LDLCALC 95 04/15/2018   TRIG 89 04/15/2018  CHOLHDL 2.6 04/15/2018   Last hemoglobin A1c No results found for: HGBA1C    The ASCVD Risk score (Arnett DK, et al., 2019) failed to calculate for the following reasons:   The 2019 ASCVD risk score is only valid for ages 83 to 31   * - Cholesterol units were assumed  Assessment & Plan:  Tonsillitis -     CBC with Differential/Platelet -     CMV abs, IgG+IgM (cytomegalovirus) -     Epstein-Barr virus VCA, IgM -     Upper Respiratory Culture, Routine  Left cervical lymphadenopathy -     CBC with Differential/Platelet -     CMV abs, IgG+IgM (cytomegalovirus) -     Epstein-Barr virus VCA, IgM -     Upper Respiratory Culture, Routine  Assessment and Plan    Acute tonsillitis with cervical lymphadenopathy Intermittent sore throat with left tonsil swelling and exudates. Negative strep test. Differential includes mononucleosis. Discussed potential mononucleosis reactivation. Emphasized accurate diagnosis to avoid inappropriate antibiotic treatment. - workup as above; if negative will start Augmentin.        No follow-ups on file.   Benton LITTIE Gave, PA    [1]  Social History Tobacco Use   Smoking status: Never   Smokeless tobacco: Never  Vaping Use   Vaping status: Never Used  Substance Use Topics   Alcohol use: Yes    Alcohol/week: 0.0 standard drinks of alcohol     Comment: Socially   Drug use: No

## 2024-03-18 NOTE — Patient Instructions (Signed)
 We drew additional labs to determine cause of your symptoms today.  If all are negative, then I will call in Augmentin for you. We will respond through Mychart once results received.

## 2024-03-19 ENCOUNTER — Ambulatory Visit: Payer: Self-pay | Admitting: Urgent Care

## 2024-03-19 DIAGNOSIS — J039 Acute tonsillitis, unspecified: Secondary | ICD-10-CM

## 2024-03-19 LAB — CMV ABS, IGG+IGM (CYTOMEGALOVIRUS)
CMV Ab - IgG: <0.6 U/mL (ref 0.00–0.59)
CMV IgM Ser EIA-aCnc: <30 [AU]/ml (ref 0.0–29.9)

## 2024-03-19 LAB — CBC WITH DIFFERENTIAL/PLATELET
Basophils Absolute: 0.1 x10E3/uL (ref 0.0–0.2)
Basos: 1 %
EOS (ABSOLUTE): 0.1 x10E3/uL (ref 0.0–0.4)
Eos: 2 %
Hematocrit: 42.2 % (ref 34.0–46.6)
Hemoglobin: 14.1 g/dL (ref 11.1–15.9)
Immature Grans (Abs): 0 x10E3/uL (ref 0.0–0.1)
Immature Granulocytes: 0 %
Lymphocytes Absolute: 2 x10E3/uL (ref 0.7–3.1)
Lymphs: 45 %
MCH: 30.5 pg (ref 26.6–33.0)
MCHC: 33.4 g/dL (ref 31.5–35.7)
MCV: 91 fL (ref 79–97)
Monocytes Absolute: 0.3 x10E3/uL (ref 0.1–0.9)
Monocytes: 8 %
Neutrophils Absolute: 2 x10E3/uL (ref 1.4–7.0)
Neutrophils: 44 %
Platelets: 244 x10E3/uL (ref 150–450)
RBC: 4.62 x10E6/uL (ref 3.77–5.28)
RDW: 11.7 % (ref 11.7–15.4)
WBC: 4.5 x10E3/uL (ref 3.4–10.8)

## 2024-03-19 LAB — EPSTEIN-BARR VIRUS VCA, IGM: EBV VCA IgM: <36 U/mL (ref 0.0–35.9)

## 2024-03-19 LAB — SPECIMEN STATUS REPORT

## 2024-03-19 LAB — UPPER RESPIRATORY CULTURE, ROUTINE

## 2024-03-20 MED ORDER — AMOXICILLIN-POT CLAVULANATE 875-125 MG PO TABS: 1.0000 | ORAL_TABLET | Freq: Two times a day (BID) | ORAL | 0 refills | Status: DC

## 2024-03-21 ENCOUNTER — Ambulatory Visit: Payer: Self-pay | Admitting: Obstetrics & Gynecology

## 2024-04-04 NOTE — Telephone Encounter (Signed)
-----   Message from Nurse Silvano SQUIBB, RN sent at 03/25/2024  8:50 AM EST ----- Regarding: needs Colposcopy appt  ----- Message ----- From: Cris Burnard DEL, MD Sent: 03/21/2024   9:08 AM EST To: Ronelle CROME Pender-Joyner, NT; [facility] [place]#  Needs appt for colposcopy.

## 2024-04-08 ENCOUNTER — Ambulatory Visit: Admitting: Physician Assistant

## 2024-04-09 ENCOUNTER — Ambulatory Visit: Admitting: Physician Assistant

## 2024-04-11 ENCOUNTER — Encounter: Payer: Self-pay | Admitting: Physician Assistant

## 2024-04-11 ENCOUNTER — Ambulatory Visit: Payer: Self-pay | Admitting: Physician Assistant

## 2024-04-11 VITALS — BP 129/73 | HR 75 | Ht 60.0 in | Wt 99.0 lb

## 2024-04-11 DIAGNOSIS — Z131 Encounter for screening for diabetes mellitus: Secondary | ICD-10-CM

## 2024-04-11 DIAGNOSIS — F988 Other specified behavioral and emotional disorders with onset usually occurring in childhood and adolescence: Secondary | ICD-10-CM

## 2024-04-11 DIAGNOSIS — F410 Panic disorder [episodic paroxysmal anxiety] without agoraphobia: Secondary | ICD-10-CM

## 2024-04-11 DIAGNOSIS — E039 Hypothyroidism, unspecified: Secondary | ICD-10-CM

## 2024-04-11 DIAGNOSIS — F411 Generalized anxiety disorder: Secondary | ICD-10-CM

## 2024-04-11 DIAGNOSIS — Z13220 Encounter for screening for lipoid disorders: Secondary | ICD-10-CM

## 2024-04-11 DIAGNOSIS — E559 Vitamin D deficiency, unspecified: Secondary | ICD-10-CM

## 2024-04-11 MED ORDER — LISDEXAMFETAMINE DIMESYLATE 40 MG PO CAPS: 40.0000 mg | ORAL_CAPSULE | ORAL | 0 refills | Status: AC

## 2024-04-11 MED ORDER — CLONAZEPAM 0.5 MG PO TABS: ORAL_TABLET | ORAL | 2 refills | Status: AC

## 2024-04-11 NOTE — Progress Notes (Signed)
 "  Established Patient Office Visit  Subjective   Patient ID: Danielle [name], female    DOB: [birth year]  Age: 34 y.o. MRN: [id number]  Chief Complaint  Patient presents with   Medical Management of Chronic Issues    HPI  34 year old female presents for follow-up for GAD and ADHD. She reports good symptom management for her ADHD with Vyvanse , which she recently switched to from Adderall in 07/2023. She notes that she has less side effects with the Vyvanse  and has good concentration at work. She reports no appetite changes, mood disturbance, or sleep changes since our last visit.   She states that her mood has been good and that she is happy with Wellbutrin  XL for her generalized anxiety disorder and depressed mood symptoms. She takes Klonopin  PRN for anxiety and would like a refill.   She has some difficulty with sleep due to her sleep schedule for work, but she tries to maintain good sleep hygiene and reports that her sleep quality is fair.   She is taking levothyroxine  for hypothyroidism. Last TSH completed 1 year ago WNL. Last lipid panel completed 5 years ago WNL.   ROS See HPI.    Objective:     BP 129/73   Pulse 75   Ht 5' (1.524 m)   Wt 99 lb (44.9 kg)   SpO2 99%   BMI 19.33 kg/m  BP Readings from Last 3 Encounters:  04/11/24 129/73  03/18/24 117/80  03/03/24 125/86   Wt Readings from Last 3 Encounters:  04/11/24 99 lb (44.9 kg)  03/18/24 97 lb (44 kg)  03/03/24 95 lb (43.1 kg)      Physical Exam Constitutional:      Appearance: Normal appearance.  HENT:     Head: Normocephalic.  Cardiovascular:     Rate and Rhythm: Normal rate and regular rhythm.  Pulmonary:     Effort: Pulmonary effort is normal.     Breath sounds: Normal breath sounds.  Neurological:     General: No focal deficit present.     Mental Status: She is alert and oriented to person, place, and time.  Psychiatric:        Mood and Affect: Mood normal.            04/11/2024   11:42  AM 02/25/2024   10:14 AM 10/09/2023   11:19 AM  PHQ9 SCORE ONLY  PHQ-9 Total Score 2 2 2       Data saved with a previous flowsheet row definition      04/11/2024   11:42 AM 02/25/2024   10:14 AM 10/09/2023   11:19 AM 07/10/2023   11:12 AM  GAD 7 : Generalized Anxiety Score  Nervous, Anxious, on Edge 1 1 2 1   Control/stop worrying 0 0 1 0  Worry too much - different things 1 1 2 1   Trouble relaxing 0 0 0 0  Restless 0 0 0 0  Easily annoyed or irritable 0 0 0 0  Afraid - awful might happen 0 0 0 0  Total GAD 7 Score 2 2 5 2   Anxiety Difficulty Somewhat difficult Not difficult at all Somewhat difficult Somewhat difficult      Assessment & Plan:  Danielle [name] was seen today for medical management of chronic issues.  Diagnoses and all orders for this visit:  Acquired hypothyroidism -     TSH + free T4 -     CMP14+EGFR -     Lipid panel -     CBC  w/Diff/Platelet -     VITAMIN D  25 Hydroxy (Vit-D Deficiency, Fractures)  Vitamin D  deficiency -     VITAMIN D  25 Hydroxy (Vit-D Deficiency, Fractures)  GAD (generalized anxiety disorder) -     CMP14+EGFR  Attention deficit disorder (ADD) without hyperactivity -     lisdexamfetamine  (VYVANSE ) 40 MG capsule; Take 1 capsule (40 mg total) by mouth every morning. -     lisdexamfetamine  (VYVANSE ) 40 MG capsule; Take 1 capsule (40 mg total) by mouth every morning. -     lisdexamfetamine  (VYVANSE ) 40 MG capsule; Take 1 capsule (40 mg total) by mouth every morning.  Screening for lipid disorders  Screening for diabetes mellitus -     CMP14+EGFR  Panic attack -     clonazePAM  (KLONOPIN ) 0.5 MG tablet; TAKE ONE TABLET BY MOUTH TWICE A DAY AS NEEDED FOR ANXIETY  Hypothyroidism -TSH, Lipid Panel, CMP, VitD and CBC  - last TSH 04/2023 (TSH: 3.05) - continue levothyroxine  50 mcg  Attention deficit disorder -continue Vyvanse  40 mg, refill sent today  Depressed mood and GAD - PHQ9: 2  GAD-7: 2 - continue Klonopin  PRN, refill sent today -  continue Wellbutrin  - discussed sleep hygiene and encouraged continuation of good sleep practices to for better sleep quality given difficulty with work schedule.    Return in about 6 months (around 10/09/2024).    Danielle Nussbaumer, PA-C  "

## 2024-04-12 LAB — LIPID PANEL
Chol/HDL Ratio: 3.5 ratio (ref 0.0–4.4)
Cholesterol, Total: 210 mg/dL — ABNORMAL HIGH (ref 100–199)
HDL: 60 mg/dL
LDL Chol Calc (NIH): 132 mg/dL — ABNORMAL HIGH (ref 0–99)
Triglycerides: 101 mg/dL (ref 0–149)
VLDL Cholesterol Cal: 18 mg/dL (ref 5–40)

## 2024-04-12 LAB — CMP14+EGFR
ALT: 12 IU/L (ref 0–32)
AST: 16 IU/L (ref 0–40)
Albumin: 4.1 g/dL (ref 3.9–4.9)
Alkaline Phosphatase: 50 IU/L (ref 41–116)
BUN/Creatinine Ratio: 13 (ref 9–23)
BUN: 10 mg/dL (ref 6–20)
Bilirubin Total: 0.9 mg/dL (ref 0.0–1.2)
CO2: 22 mmol/L (ref 20–29)
Calcium: 9.2 mg/dL (ref 8.7–10.2)
Chloride: 105 mmol/L (ref 96–106)
Creatinine, Ser: 0.75 mg/dL (ref 0.57–1.00)
Globulin, Total: 2.7 g/dL (ref 1.5–4.5)
Glucose: 87 mg/dL (ref 70–99)
Potassium: 4.3 mmol/L (ref 3.5–5.2)
Sodium: 140 mmol/L (ref 134–144)
Total Protein: 6.8 g/dL (ref 6.0–8.5)
eGFR: 108 mL/min/1.73

## 2024-04-12 LAB — CBC WITH DIFFERENTIAL/PLATELET
Basophils Absolute: 0.1 x10E3/uL (ref 0.0–0.2)
Basos: 1 %
EOS (ABSOLUTE): 0.1 x10E3/uL (ref 0.0–0.4)
Eos: 2 %
Hematocrit: 40.1 % (ref 34.0–46.6)
Hemoglobin: 13 g/dL (ref 11.1–15.9)
Immature Grans (Abs): 0 x10E3/uL (ref 0.0–0.1)
Immature Granulocytes: 0 %
Lymphocytes Absolute: 2.1 x10E3/uL (ref 0.7–3.1)
Lymphs: 42 %
MCH: 30.8 pg (ref 26.6–33.0)
MCHC: 32.4 g/dL (ref 31.5–35.7)
MCV: 95 fL (ref 79–97)
Monocytes Absolute: 0.3 x10E3/uL (ref 0.1–0.9)
Monocytes: 5 %
Neutrophils Absolute: 2.5 x10E3/uL (ref 1.4–7.0)
Neutrophils: 50 %
Platelets: 211 x10E3/uL (ref 150–450)
RBC: 4.22 x10E6/uL (ref 3.77–5.28)
RDW: 12.1 % (ref 11.7–15.4)
WBC: 5.1 x10E3/uL (ref 3.4–10.8)

## 2024-04-12 LAB — TSH+FREE T4
Free T4: 1.63 ng/dL (ref 0.82–1.77)
TSH: 1.75 u[IU]/mL (ref 0.450–4.500)

## 2024-04-12 LAB — VITAMIN D 25 HYDROXY (VIT D DEFICIENCY, FRACTURES): Vit D, 25-Hydroxy: 36.9 ng/mL (ref 30.0–100.0)

## 2024-04-15 ENCOUNTER — Ambulatory Visit: Payer: Self-pay | Admitting: Physician Assistant

## 2024-04-15 DIAGNOSIS — E7800 Pure hypercholesterolemia, unspecified: Secondary | ICD-10-CM | POA: Insufficient documentation

## 2024-04-15 NOTE — Progress Notes (Signed)
 Danielle [name],   LDL not quite to goal. This is your bad cholesterol.  Make sure avoiding fried and processed foods. Aim for 120 minutes of exercise a week.  Recheck in 1 year.  Thyroid  looks great.  Normal hemoglobin.  Kidney, liver, glucose look good.  Vitamin D  normal range.

## 2024-04-21 ENCOUNTER — Encounter: Payer: Self-pay | Admitting: Obstetrics & Gynecology

## 2024-04-24 ENCOUNTER — Telehealth: Payer: Self-pay | Admitting: Physician Assistant

## 2024-04-24 DIAGNOSIS — R3989 Other symptoms and signs involving the genitourinary system: Secondary | ICD-10-CM

## 2024-04-24 MED ORDER — CEPHALEXIN 500 MG PO CAPS: 500.0000 mg | ORAL_CAPSULE | Freq: Two times a day (BID) | ORAL | 0 refills | Status: AC

## 2024-04-24 NOTE — Progress Notes (Signed)

## 2024-05-05 ENCOUNTER — Encounter: Payer: Self-pay | Admitting: Obstetrics & Gynecology

## 2024-10-08 ENCOUNTER — Ambulatory Visit: Payer: Self-pay | Admitting: Physician Assistant
